# Patient Record
Sex: Female | Born: 1935 | Race: White | Hispanic: No | Smoking: Former smoker
Health system: Southern US, Community
[De-identification: ages and names within clinical notes are randomized; demographics above are authoritative.]

## PROBLEM LIST (undated history)

## (undated) DIAGNOSIS — E119 Type 2 diabetes mellitus without complications: Secondary | ICD-10-CM

## (undated) DIAGNOSIS — K219 Gastro-esophageal reflux disease without esophagitis: Secondary | ICD-10-CM

## (undated) DIAGNOSIS — K635 Polyp of colon: Secondary | ICD-10-CM

## (undated) DIAGNOSIS — K8020 Calculus of gallbladder without cholecystitis without obstruction: Secondary | ICD-10-CM

## (undated) DIAGNOSIS — I639 Cerebral infarction, unspecified: Secondary | ICD-10-CM

## (undated) DIAGNOSIS — K859 Acute pancreatitis without necrosis or infection, unspecified: Secondary | ICD-10-CM

## (undated) DIAGNOSIS — E785 Hyperlipidemia, unspecified: Secondary | ICD-10-CM

## (undated) DIAGNOSIS — I5189 Other ill-defined heart diseases: Secondary | ICD-10-CM

## (undated) DIAGNOSIS — I4819 Other persistent atrial fibrillation: Secondary | ICD-10-CM

## (undated) DIAGNOSIS — I10 Essential (primary) hypertension: Secondary | ICD-10-CM

## (undated) DIAGNOSIS — K449 Diaphragmatic hernia without obstruction or gangrene: Secondary | ICD-10-CM

## (undated) DIAGNOSIS — C44300 Unspecified malignant neoplasm of skin of unspecified part of face: Secondary | ICD-10-CM

## (undated) HISTORY — DX: Acute pancreatitis without necrosis or infection, unspecified: K85.90

## (undated) HISTORY — DX: Gastro-esophageal reflux disease without esophagitis: K21.9

## (undated) HISTORY — PX: OTHER SURGICAL HISTORY: SHX169

## (undated) HISTORY — DX: Unspecified malignant neoplasm of skin of unspecified part of face: C44.300

## (undated) HISTORY — PX: TONSILLECTOMY: SUR1361

## (undated) HISTORY — DX: Polyp of colon: K63.5

## (undated) HISTORY — DX: Hyperlipidemia, unspecified: E78.5

## (undated) HISTORY — PX: BREAST BIOPSY: SHX20

## (undated) HISTORY — DX: Calculus of gallbladder without cholecystitis without obstruction: K80.20

---

## 1970-09-13 HISTORY — PX: APPENDECTOMY: SHX54

## 1970-09-13 HISTORY — PX: ABDOMINAL HYSTERECTOMY: SHX81

## 1998-02-03 ENCOUNTER — Ambulatory Visit (HOSPITAL_COMMUNITY): Admission: RE | Admit: 1998-02-03 | Discharge: 1998-02-03 | Payer: Self-pay | Admitting: Gynecology

## 1998-03-04 ENCOUNTER — Other Ambulatory Visit: Admission: RE | Admit: 1998-03-04 | Discharge: 1998-03-04 | Payer: Self-pay | Admitting: Gynecology

## 1998-03-27 ENCOUNTER — Other Ambulatory Visit: Admission: RE | Admit: 1998-03-27 | Discharge: 1998-03-27 | Payer: Self-pay | Admitting: Gynecology

## 1999-01-29 ENCOUNTER — Ambulatory Visit (HOSPITAL_COMMUNITY): Admission: RE | Admit: 1999-01-29 | Discharge: 1999-01-29 | Payer: Self-pay | Admitting: Family Medicine

## 1999-01-29 ENCOUNTER — Encounter: Payer: Self-pay | Admitting: Family Medicine

## 1999-04-29 ENCOUNTER — Other Ambulatory Visit: Admission: RE | Admit: 1999-04-29 | Discharge: 1999-04-29 | Payer: Self-pay | Admitting: Gynecology

## 2000-01-08 ENCOUNTER — Encounter: Payer: Self-pay | Admitting: Gynecology

## 2000-01-08 ENCOUNTER — Encounter: Admission: RE | Admit: 2000-01-08 | Discharge: 2000-01-08 | Payer: Self-pay | Admitting: Gynecology

## 2000-01-13 ENCOUNTER — Encounter: Payer: Self-pay | Admitting: Gynecology

## 2000-01-13 ENCOUNTER — Encounter: Admission: RE | Admit: 2000-01-13 | Discharge: 2000-01-13 | Payer: Self-pay | Admitting: Gynecology

## 2000-05-26 ENCOUNTER — Other Ambulatory Visit: Admission: RE | Admit: 2000-05-26 | Discharge: 2000-05-26 | Payer: Self-pay | Admitting: Gynecology

## 2000-06-01 ENCOUNTER — Other Ambulatory Visit: Admission: RE | Admit: 2000-06-01 | Discharge: 2000-06-01 | Payer: Self-pay | Admitting: Gynecology

## 2001-01-13 ENCOUNTER — Encounter: Admission: RE | Admit: 2001-01-13 | Discharge: 2001-01-13 | Payer: Self-pay | Admitting: Gynecology

## 2001-01-13 ENCOUNTER — Encounter: Payer: Self-pay | Admitting: Gynecology

## 2001-08-03 ENCOUNTER — Other Ambulatory Visit: Admission: RE | Admit: 2001-08-03 | Discharge: 2001-08-03 | Payer: Self-pay | Admitting: Gynecology

## 2001-08-09 ENCOUNTER — Encounter: Admission: RE | Admit: 2001-08-09 | Discharge: 2001-08-09 | Payer: Self-pay | Admitting: Gynecology

## 2001-08-09 ENCOUNTER — Encounter: Payer: Self-pay | Admitting: Gynecology

## 2002-03-24 ENCOUNTER — Emergency Department (HOSPITAL_COMMUNITY): Admission: EM | Admit: 2002-03-24 | Discharge: 2002-03-24 | Payer: Self-pay | Admitting: Emergency Medicine

## 2002-03-24 ENCOUNTER — Encounter: Payer: Self-pay | Admitting: Emergency Medicine

## 2002-03-26 ENCOUNTER — Emergency Department (HOSPITAL_COMMUNITY): Admission: EM | Admit: 2002-03-26 | Discharge: 2002-03-26 | Payer: Self-pay | Admitting: Emergency Medicine

## 2002-08-20 ENCOUNTER — Encounter: Admission: RE | Admit: 2002-08-20 | Discharge: 2002-08-20 | Payer: Self-pay | Admitting: Gynecology

## 2002-08-20 ENCOUNTER — Encounter: Payer: Self-pay | Admitting: Gynecology

## 2002-08-28 ENCOUNTER — Encounter: Admission: RE | Admit: 2002-08-28 | Discharge: 2002-08-28 | Payer: Self-pay | Admitting: Gynecology

## 2002-08-28 ENCOUNTER — Encounter: Payer: Self-pay | Admitting: Gynecology

## 2002-09-04 ENCOUNTER — Other Ambulatory Visit: Admission: RE | Admit: 2002-09-04 | Discharge: 2002-09-04 | Payer: Self-pay | Admitting: Gynecology

## 2002-09-25 ENCOUNTER — Encounter: Payer: Self-pay | Admitting: General Surgery

## 2002-09-25 ENCOUNTER — Encounter: Admission: RE | Admit: 2002-09-25 | Discharge: 2002-09-25 | Payer: Self-pay | Admitting: General Surgery

## 2002-09-26 ENCOUNTER — Encounter: Payer: Self-pay | Admitting: General Surgery

## 2002-09-26 ENCOUNTER — Encounter: Admission: RE | Admit: 2002-09-26 | Discharge: 2002-09-26 | Payer: Self-pay | Admitting: General Surgery

## 2002-09-26 ENCOUNTER — Encounter (INDEPENDENT_AMBULATORY_CARE_PROVIDER_SITE_OTHER): Payer: Self-pay | Admitting: Specialist

## 2002-09-26 ENCOUNTER — Ambulatory Visit (HOSPITAL_BASED_OUTPATIENT_CLINIC_OR_DEPARTMENT_OTHER): Admission: RE | Admit: 2002-09-26 | Discharge: 2002-09-26 | Payer: Self-pay | Admitting: General Surgery

## 2003-04-18 ENCOUNTER — Ambulatory Visit (HOSPITAL_COMMUNITY): Admission: RE | Admit: 2003-04-18 | Discharge: 2003-04-18 | Payer: Self-pay | Admitting: Gastroenterology

## 2003-04-18 ENCOUNTER — Encounter: Payer: Self-pay | Admitting: Gastroenterology

## 2003-04-18 ENCOUNTER — Encounter (INDEPENDENT_AMBULATORY_CARE_PROVIDER_SITE_OTHER): Payer: Self-pay | Admitting: Specialist

## 2003-08-29 ENCOUNTER — Encounter: Admission: RE | Admit: 2003-08-29 | Discharge: 2003-08-29 | Payer: Self-pay | Admitting: Gynecology

## 2003-10-10 ENCOUNTER — Other Ambulatory Visit: Admission: RE | Admit: 2003-10-10 | Discharge: 2003-10-10 | Payer: Self-pay | Admitting: Gynecology

## 2004-08-25 ENCOUNTER — Other Ambulatory Visit: Admission: RE | Admit: 2004-08-25 | Discharge: 2004-08-25 | Payer: Self-pay | Admitting: Gynecology

## 2004-09-01 ENCOUNTER — Encounter: Admission: RE | Admit: 2004-09-01 | Discharge: 2004-09-01 | Payer: Self-pay | Admitting: Gynecology

## 2004-09-13 HISTORY — PX: HIATAL HERNIA REPAIR: SHX195

## 2005-07-18 ENCOUNTER — Inpatient Hospital Stay (HOSPITAL_COMMUNITY): Admission: EM | Admit: 2005-07-18 | Discharge: 2005-07-30 | Payer: Self-pay | Admitting: Emergency Medicine

## 2005-07-20 ENCOUNTER — Encounter (INDEPENDENT_AMBULATORY_CARE_PROVIDER_SITE_OTHER): Payer: Self-pay | Admitting: *Deleted

## 2005-07-29 ENCOUNTER — Encounter: Admission: RE | Admit: 2005-07-29 | Discharge: 2005-07-29 | Payer: Self-pay | Admitting: Neurosurgery

## 2005-08-25 ENCOUNTER — Ambulatory Visit: Payer: Self-pay | Admitting: Gastroenterology

## 2005-08-25 ENCOUNTER — Ambulatory Visit: Payer: Self-pay

## 2005-08-30 ENCOUNTER — Ambulatory Visit: Payer: Self-pay | Admitting: Cardiology

## 2005-09-01 ENCOUNTER — Ambulatory Visit (HOSPITAL_COMMUNITY): Admission: RE | Admit: 2005-09-01 | Discharge: 2005-09-01 | Payer: Self-pay | Admitting: Gastroenterology

## 2005-09-10 ENCOUNTER — Ambulatory Visit: Payer: Self-pay | Admitting: Cardiology

## 2005-09-13 HISTORY — PX: OTHER SURGICAL HISTORY: SHX169

## 2005-09-28 ENCOUNTER — Ambulatory Visit: Payer: Self-pay | Admitting: Gastroenterology

## 2005-09-28 DIAGNOSIS — K449 Diaphragmatic hernia without obstruction or gangrene: Secondary | ICD-10-CM | POA: Insufficient documentation

## 2006-01-19 ENCOUNTER — Ambulatory Visit: Payer: Self-pay | Admitting: Internal Medicine

## 2006-01-24 ENCOUNTER — Ambulatory Visit (HOSPITAL_COMMUNITY): Admission: RE | Admit: 2006-01-24 | Discharge: 2006-01-24 | Payer: Self-pay | Admitting: Internal Medicine

## 2006-02-14 ENCOUNTER — Ambulatory Visit: Payer: Self-pay | Admitting: Cardiology

## 2006-02-23 ENCOUNTER — Ambulatory Visit (HOSPITAL_BASED_OUTPATIENT_CLINIC_OR_DEPARTMENT_OTHER): Admission: RE | Admit: 2006-02-23 | Discharge: 2006-02-23 | Payer: Self-pay | Admitting: Orthopedic Surgery

## 2006-07-06 ENCOUNTER — Ambulatory Visit (HOSPITAL_BASED_OUTPATIENT_CLINIC_OR_DEPARTMENT_OTHER): Admission: RE | Admit: 2006-07-06 | Discharge: 2006-07-06 | Payer: Self-pay | Admitting: Orthopedic Surgery

## 2006-09-13 HISTORY — PX: CHOLECYSTECTOMY: SHX55

## 2006-09-15 ENCOUNTER — Other Ambulatory Visit: Admission: RE | Admit: 2006-09-15 | Discharge: 2006-09-15 | Payer: Self-pay | Admitting: Gynecology

## 2006-09-27 ENCOUNTER — Encounter: Admission: RE | Admit: 2006-09-27 | Discharge: 2006-09-27 | Payer: Self-pay | Admitting: Family Medicine

## 2006-10-07 ENCOUNTER — Encounter: Admission: RE | Admit: 2006-10-07 | Discharge: 2006-10-07 | Payer: Self-pay | Admitting: Family Medicine

## 2007-02-01 ENCOUNTER — Inpatient Hospital Stay (HOSPITAL_COMMUNITY): Admission: EM | Admit: 2007-02-01 | Discharge: 2007-02-14 | Payer: Self-pay | Admitting: Emergency Medicine

## 2007-02-01 ENCOUNTER — Ambulatory Visit: Payer: Self-pay | Admitting: Cardiology

## 2007-02-01 ENCOUNTER — Ambulatory Visit: Payer: Self-pay | Admitting: Cardiovascular Disease

## 2007-02-06 ENCOUNTER — Encounter (INDEPENDENT_AMBULATORY_CARE_PROVIDER_SITE_OTHER): Payer: Self-pay | Admitting: General Surgery

## 2007-02-07 ENCOUNTER — Ambulatory Visit: Payer: Self-pay | Admitting: Gastroenterology

## 2007-03-09 ENCOUNTER — Inpatient Hospital Stay (HOSPITAL_COMMUNITY): Admission: AD | Admit: 2007-03-09 | Discharge: 2007-03-11 | Payer: Self-pay | Admitting: General Surgery

## 2007-03-16 ENCOUNTER — Ambulatory Visit (HOSPITAL_COMMUNITY): Admission: RE | Admit: 2007-03-16 | Discharge: 2007-03-16 | Payer: Self-pay | Admitting: Gastroenterology

## 2007-03-16 ENCOUNTER — Encounter: Payer: Self-pay | Admitting: Gastroenterology

## 2007-03-18 ENCOUNTER — Emergency Department (HOSPITAL_COMMUNITY): Admission: EM | Admit: 2007-03-18 | Discharge: 2007-03-19 | Payer: Self-pay | Admitting: Emergency Medicine

## 2007-03-20 ENCOUNTER — Ambulatory Visit: Payer: Self-pay | Admitting: Gastroenterology

## 2007-03-22 ENCOUNTER — Ambulatory Visit: Payer: Self-pay | Admitting: Internal Medicine

## 2007-03-22 LAB — CONVERTED CEMR LAB
Basophils Relative: 0.3 % (ref 0.0–1.0)
Bilirubin, Direct: 0.1 mg/dL (ref 0.0–0.3)
Eosinophils Absolute: 0.1 10*3/uL (ref 0.0–0.6)
Eosinophils Relative: 1.8 % (ref 0.0–5.0)
Hemoglobin: 13.1 g/dL (ref 12.0–15.0)
Lipase: 29 units/L (ref 11.0–59.0)
Lymphocytes Relative: 25.4 % (ref 12.0–46.0)
MCV: 87.4 fL (ref 78.0–100.0)
Monocytes Absolute: 0.4 10*3/uL (ref 0.2–0.7)
Neutro Abs: 3.6 10*3/uL (ref 1.4–7.7)
Neutrophils Relative %: 65.2 % (ref 43.0–77.0)
Total Bilirubin: 0.8 mg/dL (ref 0.3–1.2)
Total Protein: 6.3 g/dL (ref 6.0–8.3)
WBC: 5.5 10*3/uL (ref 4.5–10.5)

## 2007-03-24 ENCOUNTER — Ambulatory Visit: Payer: Self-pay | Admitting: Gastroenterology

## 2007-05-04 ENCOUNTER — Ambulatory Visit (HOSPITAL_COMMUNITY): Admission: RE | Admit: 2007-05-04 | Discharge: 2007-05-04 | Payer: Self-pay | Admitting: Gastroenterology

## 2007-05-17 ENCOUNTER — Ambulatory Visit: Payer: Self-pay | Admitting: Gastroenterology

## 2007-11-09 ENCOUNTER — Encounter: Admission: RE | Admit: 2007-11-09 | Discharge: 2007-11-09 | Payer: Self-pay | Admitting: Family Medicine

## 2008-01-03 DIAGNOSIS — I10 Essential (primary) hypertension: Secondary | ICD-10-CM | POA: Insufficient documentation

## 2008-01-03 DIAGNOSIS — K219 Gastro-esophageal reflux disease without esophagitis: Secondary | ICD-10-CM | POA: Insufficient documentation

## 2008-01-03 DIAGNOSIS — E119 Type 2 diabetes mellitus without complications: Secondary | ICD-10-CM

## 2008-12-10 ENCOUNTER — Encounter: Admission: RE | Admit: 2008-12-10 | Discharge: 2008-12-10 | Payer: Self-pay | Admitting: Family Medicine

## 2009-01-30 IMAGING — RF DG CHOLANGIOGRAM ADD FILMS
13 series · 13 of 13 positions shown · non-contrast
Comparison: Intraoperative cholangiogram 02/06/07.

CLINICAL DATA: Status-post cholecystectomy for cholecystitis and choledocholithiasis.  A T-tube drainage catheter has been left in place postoperatively in the common bile duct.  Further cholangiogram assessment is now performed to determine whether there are any retained calculi in the duct. 
CHOLANGIOGRAM THROUGH PREEXISTING T-TUBE DRAINAGE CATHETER ? 02/14/07:

[Series 1: run · 1 of 1 slices shown (1 of 13)]
[im 1/1]
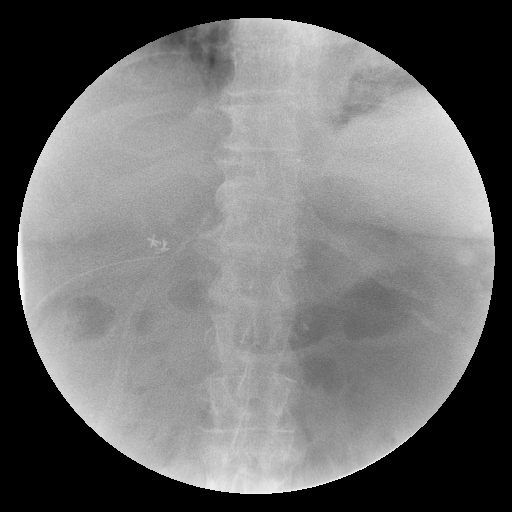

[Series 2: run · 1 of 1 slices shown (2 of 13)]
[im 1/1]
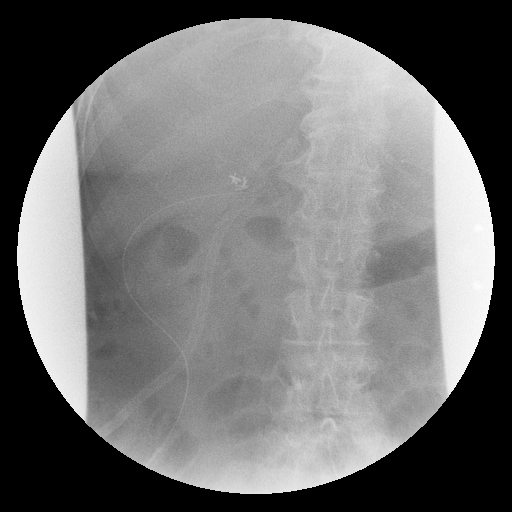

[Series 3: run · 1 of 1 slices shown (3 of 13)]
[im 1/1]
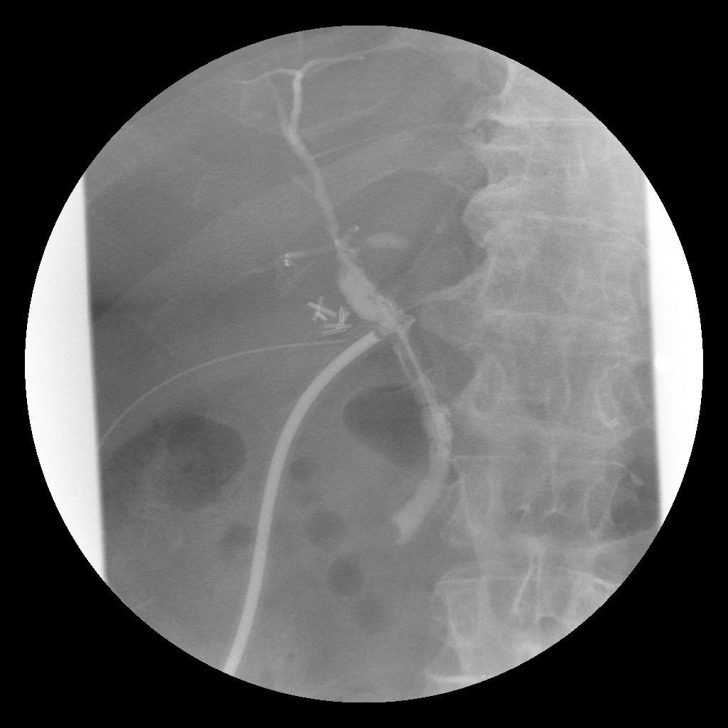

[Series 4: run · 1 of 1 slices shown (4 of 13)]
[im 1/1]
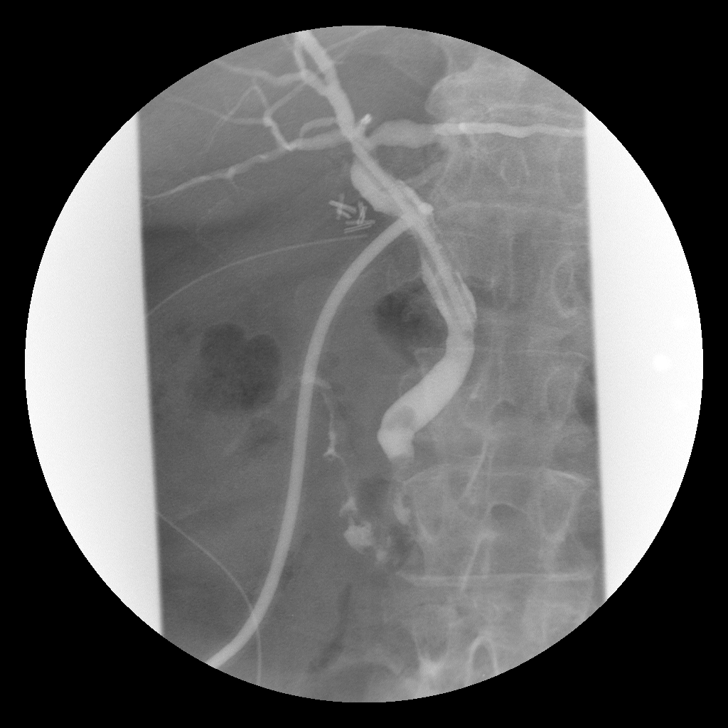

[Series 5: run · 1 of 1 slices shown (5 of 13)]
[im 1/1]
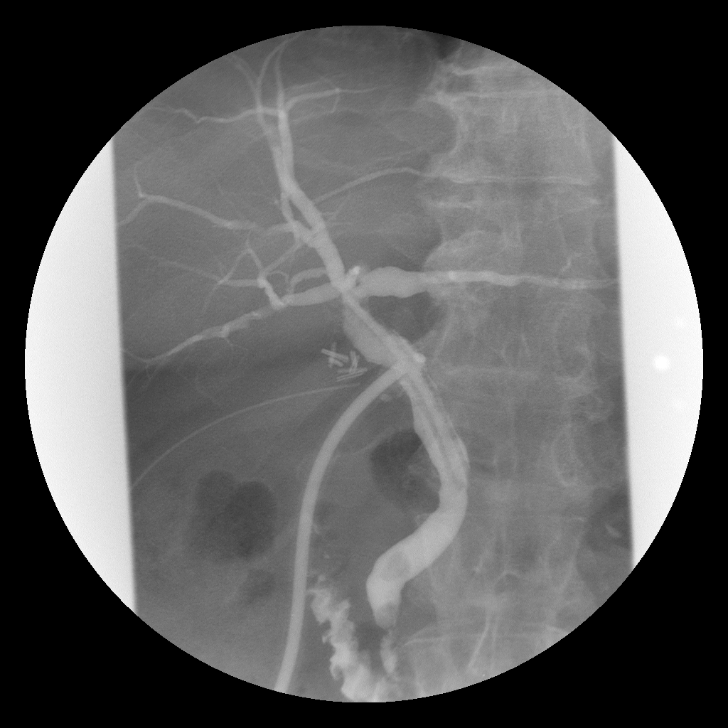

[Series 6: run · 1 of 1 slices shown (6 of 13)]
[im 1/1]
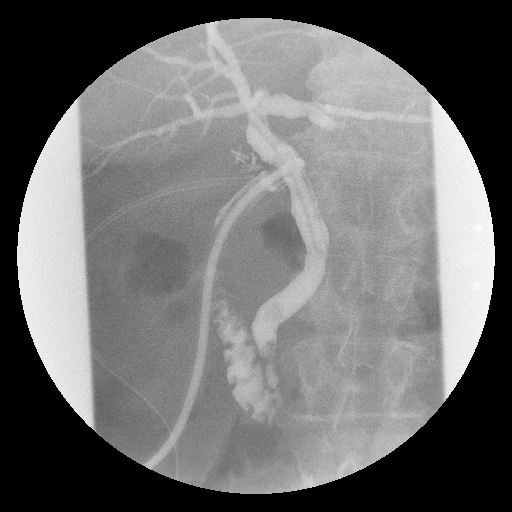

[Series 7: run · 1 of 1 slices shown (7 of 13)]
[im 1/1]
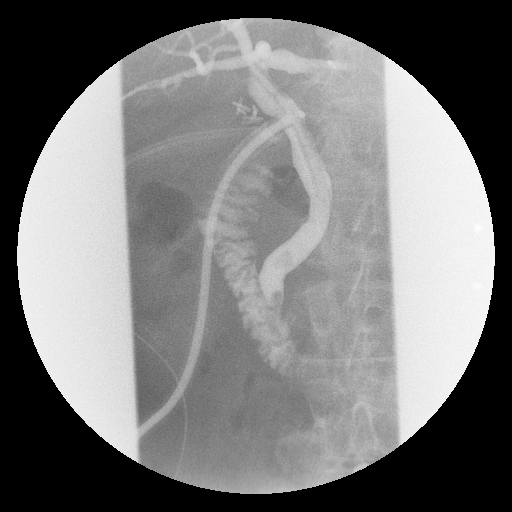

[Series 8: run · 1 of 1 slices shown (8 of 13)]
[im 1/1]
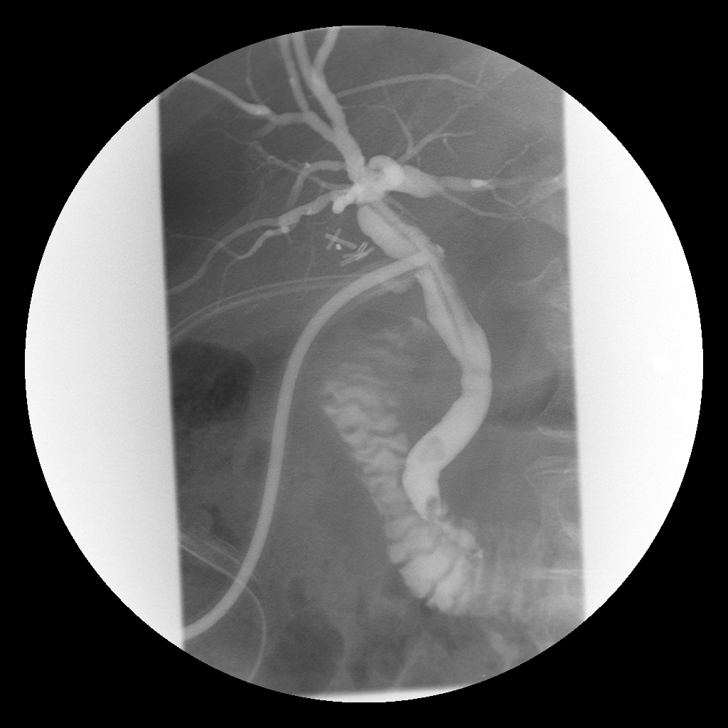

[Series 9: run · 1 of 1 slices shown (9 of 13)]
[im 1/1]
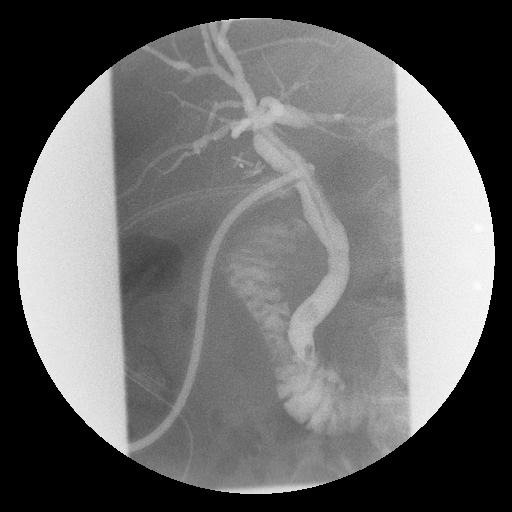

[Series 10: run · 1 of 1 slices shown (10 of 13)]
[im 1/1]
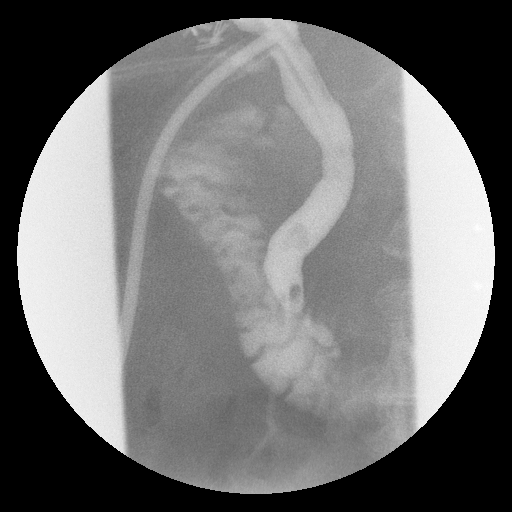

[Series 11: run · 1 of 1 slices shown (11 of 13)]
[im 1/1]
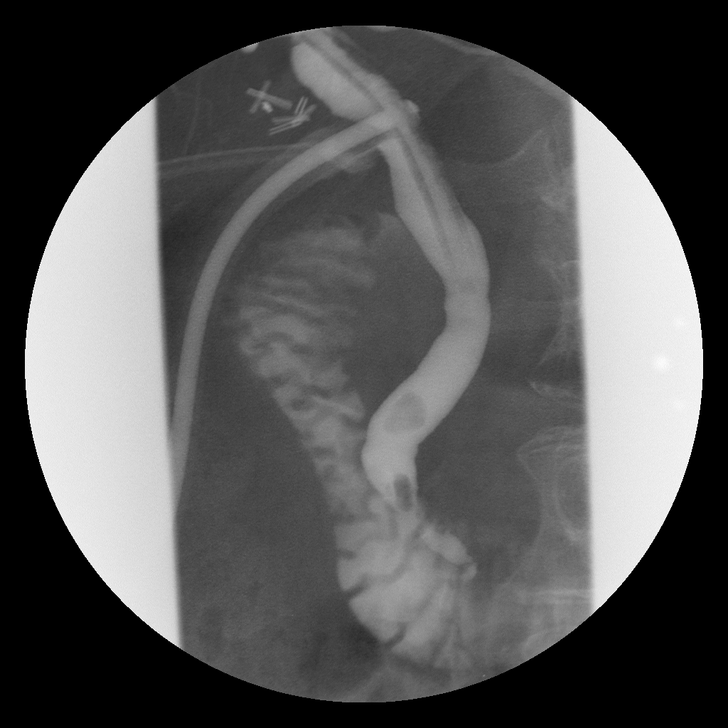

[Series 12: run · 1 of 1 slices shown (12 of 13)]
[im 1/1]
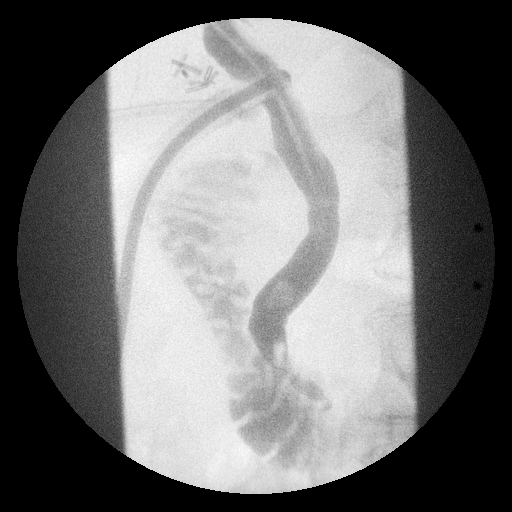

[Series 13: run · 1 of 1 slices shown (13 of 13)]
[im 1/1]
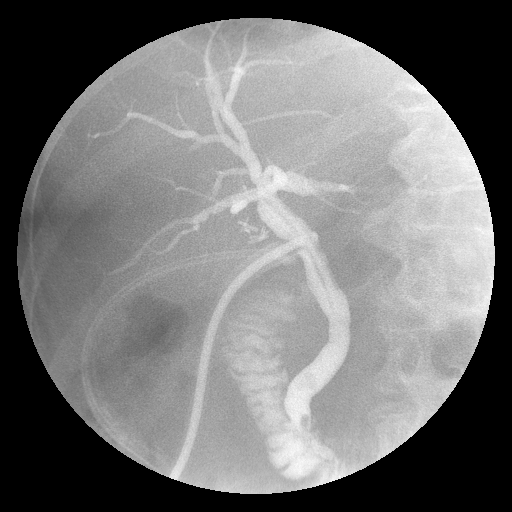

[13 of 13 positions shown; findings below may reference images not displayed]

FINDINGS: Initial fluoroscopy was performed of the right abdomen.   The percutaneous T-tube was accessed utilizing sterile technique and introduction of a needle into the external tubing.  Contras injection was then performed with instillation of Omnipaque 300 contrast material.
FINDINGS: The T-tube is patent with insertion site in the upper common bile duct. Injection of contrast material demonstrates a non-obstructed biliary system with flow of contrast into both intrahepatic ducts as well as distally into the duodenum.  There are 2 focal persistent filling defects in the distal common bile duct which do not change in shape or location during the examination.  These are likely due to retained stones in the distal duct.   These stones are not occlusive to the flow of contrast around and into the duodenum.   The tube was flushed after completion of the cholangiogram.
IMPRESSION: Two discrete filling defects in the distal common bile duct by T-tube cholangiogram consistent with retained calculi.   These do not obstruct flow of contrast into the duodenum.   No associated stricture is seen.

## 2009-04-09 ENCOUNTER — Encounter: Admission: RE | Admit: 2009-04-09 | Discharge: 2009-04-09 | Payer: Self-pay | Admitting: Family Medicine

## 2009-09-13 DIAGNOSIS — E119 Type 2 diabetes mellitus without complications: Secondary | ICD-10-CM

## 2009-09-13 HISTORY — DX: Type 2 diabetes mellitus without complications: E11.9

## 2009-12-24 ENCOUNTER — Encounter: Admission: RE | Admit: 2009-12-24 | Discharge: 2009-12-24 | Payer: Self-pay | Admitting: Family Medicine

## 2010-10-04 ENCOUNTER — Encounter: Payer: Self-pay | Admitting: Family Medicine

## 2011-01-04 ENCOUNTER — Other Ambulatory Visit: Payer: Self-pay | Admitting: Family Medicine

## 2011-01-04 DIAGNOSIS — Z1231 Encounter for screening mammogram for malignant neoplasm of breast: Secondary | ICD-10-CM

## 2011-01-11 ENCOUNTER — Ambulatory Visit
Admission: RE | Admit: 2011-01-11 | Discharge: 2011-01-11 | Disposition: A | Discharge status: Home / Self Care | Payer: Medicare Other | Source: Ambulatory Visit | Attending: Family Medicine | Admitting: Family Medicine

## 2011-01-11 DIAGNOSIS — Z1231 Encounter for screening mammogram for malignant neoplasm of breast: Secondary | ICD-10-CM

## 2011-01-26 NOTE — Consult Note (Signed)
[name], [name]                ACCOUNT NO.:  [id number]   MEDICAL RECORD NO.:  [id number]          PATIENT TYPE:  INP   LOCATION:  4714                         FACILITY:  [facility]   PHYSICIAN:  Gerrit Friends. Dietrich Pates, MD, FACCDATE OF BIRTH:  1926/06/21   DATE OF CONSULTATION:  DATE OF DISCHARGE:                                 CONSULTATION   [name] [name] number [id number] the date of 02/03/2007   REFERRING PHYSICIAN:  Dr. Tommie Ard.   PRIMARY CARE PHYSICIAN:  Dr. Aida Puffer, [place], [place].   PRIMARY CARDIOLOGIST:  Dr. Antoine Poche.   HISTORY OF PRESENT ILLNESS:  This 77 year old woman with long history of  nonsustained supraventricular tachycardia referred for evaluation of  same prior to planned cholecystectomy.  [name] [name] was admitted with  acute cholecystitis.  She has improved with antibiotic therapy.  She  apparently has a common duct stone and will require an open surgical  procedure.   Her cardiac history dates to 2006 when she was noted to have rhythm  irregularities.  Holter monitoring showed frequent PACs with occasional  runs of PSVT.  The patient has been asymptomatic with respect to her  arrhythmia.  She has never had a sustained episode of which we are  aware.  She was started on a minimal dose beta blocker and has done well  without subsequent cardiology follow-up.   The patient has had no chest discomfort.  She has no dyspnea on  exertion.  She does have diabetes managed with oral medication.  She  also has hypertension that has apparently been well-controlled with  minimal medical therapy.  She has a very remote history of cigarette  smoking.   PAST MEDICAL HISTORY:  Otherwise notable for DJD of the spine with  chronic back pain and resection of a left breast intraductal papillary  carcinoma after which no chemotherapy nor radiation therapy was  required.  She has GERD and a history of diverticulosis.  She has  undergone hysterectomy, appendectomy  and resection of a neoplastic  lesion from the skin of her neck.   MEDICATIONS ON ADMISSION:  Included Prilosec, HCTZ, KCl, metoprolol,  metformin, Enablex  and aspirin.   ALLERGIES:  She has no known allergies.   SOCIAL HISTORY:  Lives alone in [place], [place]; widowed; retired  from office work.  Approximately 20 pack-year history of cigarette  smoking that was discontinued in 1970.  No excessive use of alcohol.   FAMILY HISTORY:  Mother died at age 42 with a history of hypertension.  Father is deceased - details of medical history unknown.  She has one  sibling who has had colon cancer and one sibling who died with COPD.   REVIEW OF SYSTEMS:  Is notable for rare headaches, need for corrective  lenses, nocturia times one.  She has arthralgias in both knees.  All  other systems reviewed and are negative.   PHYSICAL EXAMINATION:  On exam, thin, exhausted woman sleeping  throughout the exam in no acute distress.  The temperature is 98.2,  heart rate 60 and regular, respirations 18, blood  pressure 105/50,  weight 64 kg, O2 saturation 98% on 2 liters.  Afebrile.  HEENT:  Normal  eyelids.  NECK:  No jugular venous distension; no carotid bruits.  LUNGS:  Clear.  CARDIAC:  Normal first and second heart sounds; modest early systolic  ejection murmur.  ABDOMEN:  Soft and nontender; normal bowel sounds; no organomegaly.  EXTREMITIES:  No edema; 1+ distal pulses.   EKG:  Sinus bradycardia with frequent PACs; nondiagnostic lateral Q-  waves; QT prolongation; delayed R-wave progression.   CHEST X-RAY:  Changes of COPD.   OTHER LABORATORY:  Notable for potassium of 2.7, normal CBC except for  platelets 125 and normal renal function.  LFTs are abnormal consistent  with her acute cholecystitis. One of two blood cultures grew E-coli.   IMPRESSION:  [name] [name] has acute cholecystitis with a retained stone  and apparently needs surgery.  She has no cardiac history that would be   indicative of increased risk.  Her arrhythmia has been mild and  transient.  She is not tolerating significant beta blocker due to sinus  bradycardia at rest.  We will try to substitute pindolol for metoprolol  to gain some intrinsic sympathomimetic activity.  Even with this, she  probably will not tolerate a substantial dose of beta blocker.  If this  trial is unsuccessful, calcium channel antagonist can be tried.  No  further cardiology testing is anticipated, and no further delay will  reduce the risk of surgery from a cardiac standpoint.  An echocardiogram  is pending.  We appreciate the request for consultation and will be  happy to follow this nice woman with you.      Gerrit Friends. Dietrich Pates, MD, [facility]  Electronically Signed     RMR/MEDQ  D:  02/03/2007  T:  02/03/2007  Job:  [id number]

## 2011-01-26 NOTE — Assessment & Plan Note (Signed)
[facility]                         GASTROENTEROLOGY OFFICE NOTE   NAME:[name] [name], [name] [name]                       MRN:          [id number]  DATE:03/24/2007                            DOB:          09-09-1926    PRIMARY CARE PHYSICIAN:  Dr. Aida Puffer.   PRIMARY SURGEON:  Dr. Durene Fruits. Carolynne Edouard.   GI PROBLEM LIST:  1. Choledocholithiasis.  Two failed ERCPs by myself and Dr. Arlyce Dice,      May 2008.  This was followed by open cholecystectomy by Dr. Demetrius Charity. S.      Toth with T-tube placement, common duct exploration.  Repeat T-tube      cholangiogram 1 month after surgery showed retained CBD stones.      Underwent combined IR/GI rendezvous procedure, July 2008 that      included biliary sphincterotomy and biliary stent placement, as      well as gallstone removal.  She is going to be arranged for a      repeat ERCP in 5-6 weeks for stent removal and repeat sweeping of      bile duct.  This was all brought about by nausea and vomiting      episodes that lead to imaging testing and labs.   INTERVAL HISTORY:  I last saw [name] [name] about a week ago at the time of her  combined rendezvous procedure.  She had some abdominal discomfort,  nausea, vomiting afterwards.  This has been a chronic problem for her  since this spring.  She presented to [facility] emergency room,  evaluation there was essentially negative.  She continued to have  discomfort since she was set up for this appointment.  Her primary issue  is nausea and intermittent vomiting.  Repeat labs done 2 days ago show  normal CBC and ALT of 99, but otherwise normal liver tests and normal  lipase.  She has been afebrile.   CURRENT MEDICATIONS:  1. Multivitamin.  2. Aspirin.  3. Calcium.  4. Metformin.  5. Metoprolol.  6. MiraLax.  7. Hydrochlorothiazide.  8. Enablex (she rarely takes this).  9. Citracal.  10.Prevacid.  11.Omega-3.  12.Prilosec.  13.Valtrex she takes daily.  She has taken this since the winter  of      2007-2008 for question of herpes infection following corneal      transplant.  She has not been back to her ophthalmologist at [facility] due to the recent biliary issues.   PHYSICAL EXAMINATION:  Weight 233 pounds, temperature 98.3, blood  pressure 122/72, pulse 80.  CONSTITUTIONAL:  Generally well appearing.  ABDOMEN:  Soft, nontender, nondistended, normal bowel sounds.  She still  had Steri-Strips in place from her open cholecystectomy which I removed.  She had a bandage at the site of the T-tube that was removed last week.  I removed this bandage and there was no drainage there.   ASSESSMENT AND PLAN:  This is a 77 year old with choledocholithiasis,  see above, continued nausea and vomiting.   Valtrex, which she has been on for 6 months, is listed as having nausea  as its most common side effect.  I recommended that she hold this for  now.  We will get in touch with her ophthalmologist at [facility], Dr.  Laurine Blazer, his phone number is [phone], to let him know we are  recommending she hold this.  She was already scheduled to see him in 3  weeks time.  She will also be getting in touch with his office to make  sure that it is safe for her to come off this medicine.  It is not  completely clear that this is the cause, but perhaps it is contributing  to her biliary nausea.  She will call me if she is not better after  holding this medicine for 7-10 days.  She has Phenergan that was called  in for her a couple of days ago.  She will take scheduled for now.  We  will make sure she has arranged for repeat ERCP in 5 weeks' time.     Rachael Fee, MD  Electronically Signed    DPJ/MedQ  DD: 03/24/2007  DT: 03/25/2007  Job #: [id number]   cc:   Baruch Merl Vernell Morgans, M.D.  Pilar Grammes, MD

## 2011-01-26 NOTE — Consult Note (Signed)
NAME:  [name] [name], [name] [name]                ACCOUNT NO.:  [id number]   MEDICAL RECORD NO.:  [id number]          PATIENT TYPE:  INP   LOCATION:  4714                         FACILITY:  [facility]   PHYSICIAN:  Ollen Gross. Vernell Morgans, M.D. DATE OF BIRTH:  01-20-1926   DATE OF CONSULTATION:  02/03/2007  DATE OF DISCHARGE:                                 CONSULTATION   GASTROENTEROLOGY:  Rachael Fee, M.D.   PRIMARY GASTROENTEROLOGIST:  Ulyess Mort, M.D.   PRIMARY CARE PHYSICIAN:  Dr. Clarene Duke in [place], [place].   ADMITTING PHYSICIAN:  [place].   CARDIOLOGIST:  Rollene Rotunda, MD, [facility].   REASON FOR CONSULTATION:  Common bile duct stone, failed ERCP x2.   HISTORY PRESENT ILLNESS:  [name] is a 77 year old female patient  with a history diabetes, hypertension, and problems related to chronic  pain from compression fractures in the thoracic spine.  This past Monday  she developed severe back pain.  Initially she thought it was related to  the T-spine problems.  The pain was radiating through to the epigastrium  and when asked about the pain she did state that it was different than  her prior back pain.  This pain lasted for several hours and resolved  spontaneously.  The pain returned on Wednesday and was unrelenting.  It  was associated this time with nausea and vomiting.  She presented to the  ER, where she was admitted by internal medicine.  An ultrasound was  performed but did not show any actual stones within the gallbladder.  But, she did have common bile duct and pancreatic ductal dilatation.  She had significant transaminitis and mild pancreatitis with mild  elevation in amylase.  Based on these findings, the patient was  admitted, GI consultation was obtained.  She has undergone ERCP x2 with  unsuccessful retrieval of stones from the common bile duct.  Because of  this, surgical consultation has been requested.   REVIEW OF SYSTEMS:  As above.  As noted, the patient has  never had back  pain like this before.  This time it was associated with nausea and  vomiting.  No definite postprandial symptoms.  No pain since admission.  She has had no fevers or chills.   SOCIAL HISTORY:  No alcohol.  No tobacco.   PAST MEDICAL HISTORY:  1. Hypertension.  2. Diabetes.  3. Colonic diverticula.  4. Right lower lobe pulmonary nodule, chronic.  5. History of recurrent SVT, previously evaluated by cardiology.  6. Thoracic compression fractures with chronic pain and radiculopathy,      status post prior epidural injections.   SURGICAL HISTORY:  1. Left breast surgery to remove intraductal papilloma in 2004.  2. Bilateral carpal tunnel release.  3. Left corneal transplant 6 months ago.   ALLERGIES:  NKDA.   CURRENT MEDICATIONS:  While hospitalized, the patient has been on  Protonix, Lopressor, multiple vitamins, IV fluids, and various p.r.n.  medications, as well as IV Zosyn. Marland Kitchen   MEDICATIONS AT HOME:  Include Prilosec, hydrochlorothiazide, potassium  replacement, metoprolol, metformin, multiple vitamin, aspirin, Citracal,  Omega-III, vitamin D, Valtrex, Enablex.   PHYSICAL EXAMINATION:  GENERAL:  Pleasant female patient currently  denying abdominal pain.  VITAL SIGNS:  Temperature 98.2, BP 103/46, pulse 61 and regular,  respirations 18.  NEURO:  The patient is alert and oriented x3, moving all extremities x4.  No focal deficits.  HEENT:  Head normocephalic.  Sclerae not injected.  No icterus of the  sclerae.  NECK:  Supple.  No adenopathy.  CHEST:  Bilateral lung sounds are clear to auscultation.  Respiratory  effort is non-labored.  She is on room air.  CARDIAC:  S1-S2 without rubs, murmurs, thrills, or gallops.  She is  currently maintaining a sinus rhythm/sinus tach with a bedside telemetry  monitor.  No JVD.  ABDOMEN:  Soft, nontender, nondistended without hepatosplenomegaly,  masses, or bruits.  EXTREMITIES:  Symmetrical in appearance with only trace  lower extremity  edema.  Pulses are palpable.   LABORATORY:  Blood cultures one bottle has shown up positive for E coli,  sensitivities are pending.  Sodium 139, potassium 2.9, CO2 26, glucose  68, BUN 18, creatinine 0.69.  Total bilirubin 1.5, alkaline phosphatase  100, AST 271, ALT 495, peak total bilirubin 4.7.  Peak AST 1720.  Peak  ALT 1029.  Amylase on admission was 148, lipase was 31.  PT 17.4, INR  1.4.  Peak white count 11,000, today it is 7200, hemoglobin 11.5, and  platelets 116,000.   DIAGNOSTIC STUDIES:  Ultrasound of the abdomen again demonstrates  gallbladder wall thickening without stones, gallbladder wall measures  3.9-mm, bile duct measured 10-mm, pancreatic duct measures 2.7-mm.   IMPRESSION:  1. Biliary colic currently resolved secondary to common bile duct      obstruction.  2. Possible E.  Coli bacteremia based on blood culture results.  3. Transaminitis, improving.  4. Mild biliary pancreatitis, resolving.  5. Hypertension.  6. Recurrent supraventricular tachycardia exacerbated by stress,      hypovolemia, and hypokalemia.  7. Diabetes.  8. Mild thrombocytopenia.   PLAN:  1. Open cholecystectomy next week for a common bile duct exploration.  2. Cardiology is currently evaluate the patient regarding the SVT.  I      have gotten a tentative okay from the PA that the patient is      appropriate for surgical anesthesia.  3. Await LFTs and platelets to further normalize before proceeding      with surgery.  4. The patient currently on Zosyn.  Follow up on blood culture since      one bottle is positive for E-coli.  5. Uncertain at this point if we can go ahead and feed the patient a      low fat liquid diet and observe her symptoms.  6. Agree with repletion of K. This will also help prevent further SVT.  7. I discussed with the patient the surgical expectations and the post-     op period, regarding incision, drain placement, and the probability      with a  common bile duct exploration she will go home with a T tube      in place.      She verbalizes understanding.  Additional risks and benefits of the      surgery regarding infection, bleeding, anesthesia risks, etc., to      be discussed by Dr. Carolynne Edouard as well.  Any other additional      recommendations per Dr. Carolynne Edouard after he evaluates the patient.      Revonda Standard  L. Rennis Harding, N.POllen Gross. Vernell Morgans, M.D.  Electronically Signed    ALE/MEDQ  D:  02/03/2007  T:  02/03/2007  Job:  [id number]   cc:   Clarene Duke, Dr.  Rollene Rotunda, MD, [facility]  Ulyess Mort, MD

## 2011-01-26 NOTE — H&P (Signed)
NAME:  [name] [name], [name] [name]                ACCOUNT NO.:  [id number]   MEDICAL RECORD NO.:  [id number]          PATIENT TYPE:  INP   LOCATION:  5524                         FACILITY:  [facility]   PHYSICIAN:  Ollen Gross. Vernell Morgans, M.D. DATE OF BIRTH:  04/15/1926   DATE OF ADMISSION:  03/09/2007  DATE OF DISCHARGE:                              HISTORY & PHYSICAL   Ms. Turner is a 77 year old white female who is now about a month out  from an open common bile duct exploration for common bile duct stones  after a failed ERCP.  T-tube cholangiogram was placed.  Initially, her  intraoperative cholangiogram did not show any retained stones, but a  subsequent T-tube cholangiogram done after surgery did show two small  retained common duct stones.  She since discharge from the hospital has  been having some significant back pain, some milder upper abdominal  pain, and has had significant problems with nausea and vomiting and  difficulty keeping food down.  She denies any fevers or chills.  She  comes to the clinic today.  Her T-tube cholangiogram has been capped for  about three weeks.  She has had nothing coming out of her JP drain.  The  JP drain was then removed today in the clinic and at that time, she was  noted to have a moderate amount of bile drainage from the JP site.   PAST MEDICAL HISTORY:  Significant for:  1. Hypertension.  2. Diabetes.  3. Colon diverticula.  4. Chronic right lung nodule.  5. History of recurrent SVT.  6. Thoracic spine fractures and radiculopathy.   PAST SURGICAL HISTORY:  Significant for:  1. Left breast lumpectomy for benign disease.  2. Bilateral carpal tunnel release.  3. Left corneal transplant.   MEDICATIONS:  Include hydrochlorothiazide, metoprolol, metformin,  potassium, multivitamin, aspirin, Caltrate, Enablex, omega3, Valtrex,  pedally, benazepril.   ALLERGIES:  No known drug allergies.   SOCIAL HISTORY:  She denies use of alcohol or tobacco products.   FAMILY HISTORY:  Noncontributory.   PHYSICAL EXAMINATION:  GENERAL:  She is a well-developed, well-nourished  white female in no acute distress.  SKIN:  Warm and dry.  No jaundice.  HEENT:  Eyes are anicteric.  Extraocular muscles intact.  Pupils equal,  round and reactive to light.  Sclerae nonicteric.  LUNGS:  Clear bilaterally with no use of accessory respiratory muscles.  HEART:  Regular rate and rhythm.  Impulse in the left chest.  ABDOMEN:  Soft with some mild upper abdominal tenderness.  Incisions  healing up nicely.  T-tube is capped.  JP is not putting out anything.  EXTREMITIES:  No clubbing, cyanosis or edema with good strength in her  arms and legs.  PSYCHOLOGICALLY:  She is alert and oriented x3 with no evidence today of  anxiety or depression.   ASSESSMENT/PLAN:  This is a 77 year old white female a month out from  common bile duct exploration.  She now has a T-tube and she does have  two small retained common duct stones on cholangiogram.  After removing  her JP today, she  had a moderate amount of bowel leakage from the JP  site.  This could be a sign of a significant myeloma which could account  for her nausea, vomiting and back pain.  She also has had trouble  keeping food down.  I suspect she is dehydrated to some level.  We will  plan to admit her to the hospital for pain management and IV hydration.  We will obtain a CT scan to look for a myeloma with possible perc drain  if she has that.  We will also consult her gastroenterologist to  consider repeat ERCP.      Ollen Gross. Vernell Morgans, M.D.  Electronically Signed     PST/MEDQ  D:  03/09/2007  T:  03/09/2007  Job:  [id number]

## 2011-01-26 NOTE — Discharge Summary (Signed)
NAME:  [name] [name], [name] [name]                ACCOUNT NO.:  [id number]   MEDICAL RECORD NO.:  [id number]          PATIENT TYPE:  INP   LOCATION:  4714                         FACILITY:  [facility]   PHYSICIAN:  Michaelyn Barter, M.D. DATE OF BIRTH:  04-15-1926   DATE OF ADMISSION:  02/01/2007  DATE OF DISCHARGE:                               DISCHARGE SUMMARY   PRIMARY CARE DOCTOR:  Dr. Clarene Duke of [place], [place].   FINAL DIAGNOSES:  1. Cholangitis.  2. Common bile duct obstruction secondary to stones.  3. Cholecystitis.  4. Escherichia coli bacteremia.  5. Hyperkalemia.  6. Transaminitis.  7. Hypoalbuminemia.  8. Arrhythmia.   CONSULTS:  1. Gastroenterology, [place], Dr. Arlyce Dice.  2. General surgery, Dr. Carolynne Edouard.  3. Cardiology, [facility], Dr. Dietrich Pates.   PROCEDURES:  1. Laparoscopic cholecystectomy with intraoperative cholangiogram and      open common bile duct exploration with the placement of a T-tube,      completed by Dr. Carolynne Edouard on Feb 06, 2007.  2. Portable chest x-ray completed Feb 01, 2007.  3. CT scan of the chest with angiographic contrast on Feb 01, 2007.  4. X-ray of the thoracic and lumbar spine on Feb 01, 2007.  5. Complete abdominal x-ray on Feb 01, 2007.   HISTORY OF PRESENT ILLNESS:  Ms. Birden is a 77 year old female who  arrived complaining of mid region lower back pain that started  approximately two nights prior to admission.  Her pain was just below  her shoulder blades and characterized as a 10/10 in intensity.  Her pain  had been constant up until the morning of her admission, at which time  it subsided; however, later, at approximately 1:00 a.m., she was  awakened by a recurrence.  She went on to develop nausea and vomiting as  well as chills and was brought to the hospital for further evaluation.  At the time of her admission, she did not complain of any abdominal  pain.   For past medical history, please see that dictated by Dr. Michaelyn Barter.   HOSPITAL COURSE:  1. Cholangitis, common bile duct obstruction, cholecystitis:  The      patient's liver enzymes were found to be significantly elevated      with a total bilirubin of 3.5, direct bilirubin of 2.1, and an alk      phos of 428, SGOT 1720, and an SGPT of 1029.  The patient underwent      an ultrasound of her abdomen on Feb 01, 2007.  It revealed mild      thickening of the gallbladder wall without a Murphy's sign or      stone, moderate dilatation of the common bile duct and pancreatic      duct, a distal stone or obstruction cannot be excluded.  Diffuse      fatty infiltration of the liver was also noted.  A CT scan of the      patient's chest with angiographic contrast was done on Feb 01, 2007.  It revealed no pulmonary emboli but found fluid  around the      gallbladder and extra hepatic bile ducts.  Findings concerning for      inflammatory process such as cholecystitis or even duodenitis.  At      that time, an ultrasound was recommended.  The patient was started      on Zosyn empirically.  Gastroenterology was consulted.  The      following day, an ERCP was attempted by Dr. Rob Bunting.  Dr.      Christella Hartigan indicated that the procedure failed secondary to the patient      having a very small major papilla.  It was recommended after a      second attemptted  ERCP completed by Dr. Melvia Heaps that a      laparoscopic cholecystectomy be performed.  General surgery was      consulted.  On Feb 06, 2007, Dr. Carolynne Edouard performed a laparoscopic      cholecystectomy with intraoperative cholangiogram and open common      bile duct exploration with the placement of a T-tube.  Patient      appears to have tolerated the procedure well.  The patient has not      complained of any significant pain with regards to her abdomen      since her admission to the hospital.  By day #2, the patient was no      longer complaining of any back pain.   1. Transaminitis:  Again, this  is most likely secondary to gallstone      obstructing the common bile duct.  Following the patient's      admission into the hospital and the initiation of Zosyn, the      patient's liver enzymes gradually began to decline and have      continued to do so throughout the course of her hospitalization.   1. Bacteremia secondary to E. coli:  Blood cultures were completed and      confirmed the presence of E. coli and the patient's blood on Feb 01, 2007.  Sensitivities were reported to Zosyn; therefore, this      antibiotic has continued.  The patient has had a normal white count      as well as been afebrile throughout her hospitalization.   1. Arrhythmia:  It was noted that the patient had a 20-beat run of SVT      at approximately 1:00 a.m. on Feb 03, 2007.  She also experienced      frequent PACs but otherwise appeared to be asymptomatic.  A 2D      echocardiogram was completed on Feb 03, 2007.  It revealed overall      left ventricular systolic function to be normal, left ventricular      EF was estimated to be 55%.  Cardiology was consulted in light of      the patient's being prepped for surgery.  They indicated on May 23      when they first saw the patient that her arrhythmia was mild and      transient.  She was not tolerating significant beta blockers due to      sinus bradycardia at rest.  Pindolol was substituted for metoprolol      to gain some intrinsic sympathomimetic activity.  No further      cardiology testing was deemed necessary after that; however,      cardiology has continued to follow the patient throughout the  course of her hospitalization.   1. Hypokalemia:  This was supplemented over the course of the      patient's hospitalization.   1. Hypoalbuminemia:  There may be a component of protein calorie      malnutrition to this.  This may be further addressed once the      patient's diet is advanced.   1. The patient currently has both a J-P drain  placed and a T-tube      present.   1. Back pain:  Again, since the patient's admission into the hospital,      she has not complained of any significant back pain.  X-rays of the      patient's lumbar and thoracic spine were completed on Feb 01, 2007.      A four-view x-ray of the patient's lumbar spine revealed no acute      findings in the lumbar area.  X-rays of the thoracic spine revealed      a mild compression fracture of T10, age indeterminate, but given      the degenerative changes, the      radiologist indicated that he favored it to be an old compression      fracture.  No further workup has taken place.  Actually, this      situation has not yet been discussed with the patient in light of      all of the other medical issues that have had to be addressed up      until this point.      Michaelyn Barter, M.D.  Electronically Signed     OR/MEDQ  D:  02/08/2007  T:  02/08/2007  Job:  [id number]

## 2011-01-26 NOTE — Discharge Summary (Signed)
NAME:  [name] [name], [name] [name]                ACCOUNT NO.:  [id number]   MEDICAL RECORD NO.:  [id number]          PATIENT TYPE:  INP   LOCATION:  5735                         FACILITY:  [facility]   PHYSICIAN:  Ollen Gross. Vernell Morgans, M.D. DATE OF BIRTH:  June 03, 1926   DATE OF ADMISSION:  03/09/2007  DATE OF DISCHARGE:  03/11/2007                               DISCHARGE SUMMARY   Ms. Hodder is a 77 year old white female who had had a cholecystectomy  and common bile duct exploration about a month earlier.  She had a T-  tube in.  She had been having significant back pain and nausea and  vomiting.  She had a T-tube cholangiogram that showed no leak and so in  the clinic on March 09, 2007, her T-tube was removed.  At that time, a  moderate amount of bile fluid drained from the site and at that point,  we were concerned that she could still have a bile leak that was not  demonstrated by the cholangiogram.  Given her amount of pain, we  admitted her to the hospital for IV hydration and pain control.   She underwent a CT scan which did not show any significant fluid  collection near the bile duct.  She did appear to have some retained  common duct stones.  Her T-tube was not removed in the clinic, but she  was admitted to hospital and T-tube cholangiogram showed no evidence of  leak, but she did seem to have some retained common duct stones, so GI  was consulted to help with this.  She improved with hydration and her  LFTs and white count were normal, so she was allowed to be discharged  home with followup to GI for planning to try to repeat an ERCP to remove  her common duct stones.      Ollen Gross. Vernell Morgans, M.D.  Electronically Signed     PST/MEDQ  D:  04/27/2007  T:  04/28/2007  Job:  [id number]

## 2011-01-26 NOTE — Op Note (Signed)
NAME:  [name] [name], DINO                ACCOUNT NO.:  [id number]   MEDICAL RECORD NO.:  [id number]          PATIENT TYPE:  INP   LOCATION:  4714                         FACILITY:  [facility]   PHYSICIAN:  Ollen Gross. Vernell Morgans, M.D. DATE OF BIRTH:  1926-05-21   DATE OF PROCEDURE:  DATE OF DISCHARGE:                               OPERATIVE REPORT   PREOPERATIVE DIAGNOSES:  Gallstones and common bile duct stone.   POSTOPERATIVE DIAGNOSES:  Gallstones and common bile duct stone.   PROCEDURES:  Laparoscopic cholecystectomy with intraoperative  cholangiogram and open common bile duct exploration with placement of a  T-tube.   SURGEON:  Dr. Carolynne Edouard.   ASSISTANT:  Dr. Jamey Ripa.   ANESTHESIA:  General endotracheal.   PROCEDURE:  After informed consent was obtained, the patient was brought  to the Operating Room, placed in the supine position on the operating  table.  After adequate induction of general anesthesia, the patient's  abdomen was prepped with Betadine and draped in usual sterile manner.  The area below the umbilicus was infiltrated with 0.25% percent  Marcaine.  A small incision was made with a 15 blade knife.  This  incision was carried down through the skin and subcutaneous tissue  bluntly with the hemostat and [employer] retractors until the linea alba  was identified.  The linea alba was incised with a 15 blade knife and  each side was grasped with Kocher clamps and elevated anteriorly.  The  preperitoneal space was then probed bluntly with a hemostat until the  peritoneum was opened and access was gained to the abdominal cavity.  A  0 Vicryl pursestring stitch was placed in the fascia around the opening.  A sonic cannula was placed through the opening and anchored in place  with the previous 0 Vicryl pursestring stitch.  The abdomen was then  insufflated with carbon dioxide without difficulty.  The patient was  then placed in head-up position, rotated slightly with the right side  up.   The laparoscope was inserted through the sonic cannula and the  right upper quadrant was inspected.  The dome of the gallbladder and  liver readily identified.  There were no real adhesions to the abdominal  wall that could be appreciated.  Next, the epigastric region was  infiltrated with 0.25% Marcaine.  A small incision was made with a 15  blade knife and a 10 mm port was placed bluntly through this incision  into the abdominal cavity under direct vision.  Sites were then chosen  laterally on the right side of the abdomen with placement of 5-mm ports.  Each of these areas were infiltrated with 0.25% Marcaine.  Small stab  incisions were made with a 15 blade knife and 5 mm ports were placed  bluntly through the incisions into the abdominal cavity under direct  vision.  A blunt grasper was placed through the lateral most 5 mm port  and used to grasp the dome of the gallbladder and elevated anteriorly  and superiorly.  Another blunt grasper was placed through the other 5 mm  port and used to retract  on the body and neck of the gallbladder.  A  dissector was placed through the epigastric port and, using the  electrocautery, some omental adhesions were taken down from the body of  the gallbladder as well as using some laparoscopic scissors down near  the duodenum.  Once this was accomplished, the gallbladder was easily  identified.  Using the dissector and electrocautery, the peritoneal  reflection at the gallbladder neck was opened.  Blunt dissection was  then carried out in this area until the gallbladder neck cystic duct  junction was readily identified and a good window was created.  A single  clip was placed on the gallbladder neck.  A small ductotomy was made  just below the clip with the laparoscopic scissors.  A 14-gauge  Angiocath was placed percutaneously through the anterior abdominal wall  under direct vision.  The Reddick cholangiogram catheter was placed  through the Angiocath  and flushed.  The Reddick catheter was then placed  in the cystic duct and anchored in place with a clip.  A cholangiogram  was obtained that did show two filling defects in the distal common bile  duct.  We were unable to feed the Reddick catheter down into the common  bile duct because of the valves inside the cystic duct.  Attempts were  made at laparoscopically placing the choledochoscope down the cystic  duct, but again we were unable to feed it due to the valves.  At this  point. the Angiocath and catheters were removed from the patient.  Two  clips were placed proximally on the cystic duct and the duct was divided  with two sets of clips.  The gallbladder was then detached from the  liver bed using the hook electrocautery without difficulty.  The  gallbladder was placed up over the liver.  At this point, a right  subcostal incision was then made with a 10 blade knife.  This incision  was carried down through the skin and subcutaneous tissue sharply with  electrocautery.  The fascia of the anterior abdominal wall and muscles  of the abdominal wall were also divided sharply with the electrocautery.  The peritoneum was also opened with the electrocautery thereby allowing  [place] access to the abdominal cavity and the rest of the incision was  opened under direct vision.  A Bookwalter retractor was deployed.  The  common duct was dilated and easily visualized on the anterior surface.  A site was chosen just below the junction with the cystic duct on the  anterior surface of the common bile duct to open the common bile duct.  Two lateral stay sutures were placed with 4-0 PDS in these areas and a  common ductotomy was made with an 11 blade knife and Potts scissors.  Attempts were made to pass Fogarty catheters and curved forceps down the  common duct to retrieve the stones.  We were able to get some debris  out, but no well-defined stone.  We also fed the choledochoscope down there and were  unable to identify a distinct stone.  I believe when we  passed the forceps the patient had very soft stones that were broken up  into debris and we were able to then flush the debris out.  At this  point, a T-tube with a 20-French stem, 16-French T was chosen and the  limbs were cut to an appropriate size.  The back wall of the T was  removed sharply with scissors.  The T-tube was  then placed into the  common duct with the short end superior and longer end inferior.  The  ductotomy was then closed with interrupted 4-0 PDS sutures so that the  closure was fairly snug against the T-tube.  A cholangiogram was then  obtained through this T-tube that showed no more filling defects in the  common duct and good emptying into the duodenum.  The T-tube was then  brought through one of the lateral most 5 mm ports using a tonsil clamp  and was placed a bag drainage and another stab incision was made just  inferior to this for a 19-French round Blake drain which was also  brought through the abdominal wall with a tonsil clamp and the drain was  placed in the gallbladder bed just lateral to the T-tube.  This  operative site was then examined and irrigated and found to be  completely hemostatic.  The gallbladder was retrieved and sent to  pathology.  The two drain tubes were then anchored to the skin with 3-0  nylon stitches.  The wound was then closed with two layers of #1 running  PDS suture.  Each layer was irrigated and the skin was all closed with  staples.  Sterile dressings were applied.  The patient tolerated the  procedure well.  At the end of the case, all needle, sponge and  instrument counts were correct.  The patient was then awakened, taken to  Recovery in stable condition.      Ollen Gross. Vernell Morgans, M.D.  Electronically Signed     PST/MEDQ  D:  02/06/2007  T:  02/06/2007  Job:  [id number]

## 2011-01-26 NOTE — H&P (Signed)
NAME:  [name] [name], [name] [name] NO.:  [id number]   MEDICAL RECORD NO.:  [id number]          PATIENT TYPE:  EMS   LOCATION:  MAJO                         FACILITY:  [facility]   PHYSICIAN:  Michaelyn Barter, M.D. DATE OF BIRTH:  May 30, 1926   DATE OF ADMISSION:  02/01/2007  DATE OF DISCHARGE:                              HISTORY & PHYSICAL   PRIMARY CARE PHYSICIAN:  Dr. Clarene Duke of [place], [place], therefore  she is unassigned.   GASTROENTEROLOGIST:  Dr. Victorino Dike.   CHIEF COMPLAINT:  Back pain.   HISTORY OF PRESENT ILLNESS:  [name] [name] is a 77 year old female with a  past medical history of hypertension and diabetes mellitus who states  that she developed mid region lower back pain approximately 2 night ago.  She described the pain as being just below her shoulder blades. She  characterizes it as 10/10. She states that she does have a history of  back pain and was treated for it approximately a year or two ago and she  states that the pain feels very similar. The pain had been constant up  until yesterday morning at which time it subsided. However this morning  at approximately 1:00 a.m., she was awakened by a reoccurrence of the  back pain. She has also developed nausea and emesis. She complains of  some chills but denies fevers. She had a loose bowel movement this  morning. She denies having any abdominal pain.   PAST MEDICAL HISTORY:  1. Hypertension.  2. History of intraductal papilloma removed from the left breast in      2004.  3. Colonic diverticulosis.  4. Diabetes mellitus.  5. Thoracic back pain secondary to T7-8 radiculopathy. The patient has      received epidural injections in the past.  6. Old compression fracture at T10 level as well as T10-11 disk      protrusion.  7. Right lower lobe pulmonary nodule.   PAST SURGICAL HISTORY:  1. Decompression of the right median nerve at the wrist as well as      decompression of the right ulnar nerve at the  elbow secondary to      carpal tunnel syndrome involving the right hand as well as ulnar      neuropathy.  2. Decompression left ulnar nerve of the left elbow completed February 23, 2006 by Dr. Cindee Salt.  3. Left eye corneal transplant.  4. Intraductal papilloma removed from the left breast in 2004 via      excisional biopsy.   ALLERGIES:  No known drug allergies.   CURRENT MEDICATIONS:  1. Prilosec.  2. Hydrochlorothiazide.  3. Potassium chloride.  4. Metoprolol.  5. Metformin.  6. Multivitamin.  7. Aspirin.  8. Citracal.  9. Omega 3.  These are all take in the morning.   In the evening the patient takes:  1. Metoprolol.  2. Citracal.  3. Enablex.  4. Valtrex.  5. Vitamin D.   SOCIAL HISTORY:  Alcohol the patient denies. Cigarettes the patient  denies.   FAMILY HISTORY:  Mother had a  history of hypertension. Father medical  history is unknown. The patient's sister has a history of colon cancer.   REVIEW OF SYSTEMS:  As per HPI otherwise all other systems are negative.   PHYSICAL EXAMINATION:  GENERAL:  The patient is awake, she is  cooperative, she is actively having emesis consisting of large amounts  of clear phlegm as I attempt to interview her.  VITAL SIGNS:  Temperature was 101.6, blood pressure 135/59, heart rate  83, respirations 24, O2 sat 97%.  HEENT:  Normocephalic, atraumatic. Extraocular movements intact.  Decreased pupil response to light bilaterally.  NECK:  Supple, no JVD, no thyromegaly.  CARDIAC:  S1, S2 present. Regular rate and rhythm.  RESPIRATORY:  No crackles or wheezes.  ABDOMEN:  Flat, soft. There is some vaguely described tenderness across  the left lower and mid lower region of the patient's abdomen. The liver  edge is slightly palpable. There is no rebound, there is no guarding.  The patient denies having any tenderness to palpation of the right upper  quadrant of her abdomen.  EXTREMITIES:  No leg edema.  NEUROLOGIC:  The patient  is alert and oriented x3. Cranial nerves II-XII  are intact. The patient has decreased pupillary constriction to light.  5/5 upper and lower extremity strength.   LABORATORY DATA:  The patient's bilirubin total is 3.5, bilirubin direct  2.1 and direct bilirubin 1.4. Alkaline phosphatase is 128, SGOT 1720.  SGPT 1029. Total protein 6.0, albumin 3.2. Urinalysis is negative. CBC,  white blood cell count of 5.8, hemoglobin 13.7, hematocrit 40.3,  platelet count 125.   The patient had an ultrasound of her abdomen completed which revealed  moderate dilation of the common bile duct and pancreatic duct. Cannot  exclude distal obstruction. Diffuse fatty infiltration of the liver, 1.9  cm simple cyst of the lower pole of the left kidney.   CT scan of the patient's chest with angiographic contrast revealed no  PE. Fluid around the gallbladder and common bile duct concerning for  inflammation and cholecystitis. The gallbladder is not completely imaged  and could be evaluated with ultrasound. A stable 12-mm nodule in the  right lower lobe with new areas of atelectasis or nodularity.   Chest x-ray revealed COPD, no active cardiopulmonary disease.   ASSESSMENT/PLAN:  1. Acute cholecystitis:  The patient's current complaints are somewhat      atypical in that she does not complain of any abdominal pain at the      present time. However, her back pain may be associated with acute      cholecystitis. Given the fact that the patient has an ultrasound as      well as a CAT scan and liver enzymes that are very elevated, one      has to be concerned that the acute cholecystitis may be      contributing to the patient's current back pain. Will check an      amylase and lipase and will also consult gastroenterology. The      patient may need an ERCP. In addition will also continue p.r.n.      pain medications for now. 2. Back pain:  This may be secondary to acute cholecystitis. Will      however consider  further workup i.e. other imaging studies of the      patient's back after the issues involving her gallbladder and      common bile duct are fully addressed. Will again continue p.r.n.  pain medication for now.  3. Transaminitis:  This may be secondary to a blockage within the      common bile duct. Again will discuss this with gastroenterology. I      suspect that the patient may need an endoscopic retrograde      cholangiopancreatography and possibly a cholecystectomy at some      point in the future.  4. Hypoalbumin:  This may be secondary to liver disease and/or protein      calorie malnutrition. Will check a prealbumin level for now.  5. History of hypertension. This currently appears to be stable. Will      resume the patient's prior home medications.  6. History of diabetes mellitus:  Will initiate Accu-Cheks for now.      Will hold the patient's metformin in light of her being made n.p.o.      and will monitor her sugars closely.  7. Nausea and vomiting:  Will start Zofran on a p.r.n. basis.  8. Gastrointestinal prophylaxis:  Will provide Protonix.      Michaelyn Barter, M.D.  Electronically Signed     OR/MEDQ  D:  02/01/2007  T:  02/01/2007  Job:  [id number]

## 2011-01-29 NOTE — H&P (Signed)
NAME:  [name] [name], [name] [name]                ACCOUNT NO.:  [id number]   MEDICAL RECORD NO.:  [id number]          PATIENT TYPE:  INP   LOCATION:  1417                         FACILITY:  [facility]   PHYSICIAN:  Marcie Mowers, M.D.DATE OF BIRTH:  July 02, 1926   DATE OF ADMISSION:  07/18/2005  DATE OF DISCHARGE:                                HISTORY & PHYSICAL   The patient is unassigned   CHIEF COMPLAINTS:  Sudden onset of upper back pain for 3 days, nausea,  weakness, and unable to walk.   HISTORY OF PRESENT ILLNESS:  This is a 77 year old Caucasian female who was  in her usual state of health approximately 2 days ago when she started a  sudden onset of pain in her upper back. The pain has been constant since  then, approximately 8/10 severity, no aggravating or relieving factors. At  the same time the patient became nauseated and had several episodes of  emesis on that particular day and one episode of emesis yesterday. Three  days ago the patient did go to her primary care physician and was given  Phenergan and tramadol for her back pain. The patient has been unable to  take anything p.o. for the last 3 days, has been feeling weak, unable to  walk because my legs give away.   The patient denies any headaches, visual changes, any recent sore throat or  rhinorrhea; she does not have any cough. She does not have any chest pain or  shortness of breath. She denies any abdominal pain, change in her bowel  habits. There is no dysuria, urgency, or frequency of urination. She does  mention that, because of nausea and vomiting, her appetite has been low for  the last 3 days. She denies any weight loss.   PAST MEDICAL HISTORY:  1.  Hypertension.  2.  The patient has had a history of intraductal papilloma removed from left      breast in 2004. This was noticed on her mammogram as calcifications, and      then the excision biopsy was done.  3.  The patient had a colonoscopy in 2004 which revealed  extensive left      colonic diverticulosis.   The patient does not have diabetes, and she does not have any cardiac  history.   The patient does not have any history of surgeries in the past.   MEDICATIONS:  1.  Hydrochlorothiazide 25 mg p.o. daily.  2.  Aspirin 81 mg daily.  3.  Multivitamins.  4.  Citracal.  5.  For the last 3 days, the patient has been on p.r.n. tramadol and      Phenergan.   ALLERGIES:  No known drug allergies.   SOCIAL HISTORY:  The patient does not smoke or drink alcohol or use any  illicit drugs. She is very active, overall healthy, elderly female. She  lives by herself and performs all her activities of daily living and  instrumental activities of daily living independently. She drives. She has  her close family living in the area. At the current time, her daughter is  by  the bedside.   FAMILY HISTORY:  There is no cardiac history in her family. No diabetes in  her family. There is a history of colon cancer in her sister.   REVIEW OF SYSTEMS:  As mentioned in History of Present Illness.   PHYSICAL EXAMINATION:  GENERAL APPEARANCE:  This is a pleasant Caucasian  female who does not appear to be in acute distress.  VITAL SIGNS: Her temperature is 98.1 degrees. Her blood pressure is 125/61  lying.  Her blood pressure decreased to 79/45 in the standing position, and  her heart rate went up to 112 per minute from 90 per minute indicating  positive orthostatic changes.  Respirations are 20 per minute.  She is saturating 96% on room air.  HEENT: Her head is atraumatic; pupils are equally reactive to light;  extraocular muscles are intact; sclerae are nonicteric. Her oral mucosae  appear dry. Pharynx is without any exudates or erythema.  NECK:  Supple. There is no jugular venous distension, no lymphadenopathy, no  thyromegaly, no carotid bruits bilaterally.  CARDIOVASCULAR:  S1-S12 normal, no murmurs, regular rate and rhythm.  CHEST: There is no palpation  tenderness anteriorly.  PULMONARY:  Clear to auscultation bilaterally, no adventitious sounds.  BACK: Examination reveals the presence of palpation tenderness in the lower  thoracic area. There is some point tenderness at approximately T9-T10 area.  ABDOMEN:  Is soft, nondistended, nontender. There is no hepatosplenomegaly.  Bowel sounds are normally active in all quadrants.  EXTREMITIES: Are without any cyanosis, clubbing or edema.  NEUROLOGIC: Her cranial nerves II-XII are intact. Motor strength is 5/5.  Deep tendon reflexes are 2+ all over. Her plantars are downgoing  bilaterally. Cerebellar signs are negative. Because the patient is feeling  very weak, I could not assess her gait. Overall, the neurological  examination seems to be without any deficit.   Labs show white count is 10,400 with 84% neutrophils, 11% lymphocytes and 5%  monocytes. Hemoglobin is 15.8; hematocrit is 45, platelet count of 211,000.  Her sodium is low at 127. Potassium is low at 3.1, chloride 87, CO2 27, BUN  31, and creatinine 1.2, with blood glucose of 147. Total protein is 6.7, AST  23, ALT 25, albumin 2.2, alkaline phosphatase 80, total bilirubin 0.9.  Urinalysis is within normal limits. First set of cardiac enzymes is pending  at this time.   Radiology data, A CT scan of her chest is negative for aortic dissection or  PE. It does show an incidental nodule; this nodule is 12 mm, solid, and  lobulated, and it is in the posterior base of her right lower lobe.   A 12-lead EKG is normal sinus rhythm. Comparison is noted with her old EKG  from 2004, and there is new appearance of lateral T-wave inversions.   ASSESSMENT AND PLAN:  1.  Sudden onset upper back pain, and an abnormal EKG. At this time I will      admit the patient to telemetry, start her on aspirin and low-dose beta      blockers. I will cycle her cardiac enzymes as well. I will get a     followup 12-lead EKG. At this time, the significance of this  upper back      pain is unclear. I am not sure if the abnormal EKG and the back pain are      related.  2.  Hyponatremia. I will start IV hydration with normal saline. Again, the  exact etiology of this hyponatremia is unclear. I will get urine      osmolarity, plasma osmolarity, and urine sodium. Given that the patient      is dehydrated, higher sodium is expected, and this value of 127 is on      the lower side. I will also get a TSH.  3.  Hypokalemia.  Will replete IV as well as p.o.  4.  Incidental finding of a 12 mm nodule on CT scan of the chest. The      patient is a nonsmoker, and she does not have any secondary exposure to      smoke. An attempt can be made to obtain an old x-ray or a CT scan if she      has had in the past. We will look in detail the appearance of this      nodule and how significant it can be as far as malignancy.  5.  Intractable back pain, 3 days of duration. The patient has this new-      onset back pain, and please see problem #1. I will also get plain x-rays      of the thoracolumbar spine. We will start her on analgesics.  6.  For prophylaxis of deep vein thrombosis, I will put her on Lovenox.  7.  For peptic ulcer disease prophylaxis, will start Protonix.           ______________________________  Marcie Mowers, M.D.     PMJ/MEDQ  D:  07/18/2005  T:  07/18/2005  Job:  [id number]

## 2011-01-29 NOTE — Discharge Summary (Signed)
[name] [name], [name] [name]                ACCOUNT NO.:  [id number]   MEDICAL RECORD NO.:  [id number]          PATIENT TYPE:  INP   LOCATION:  1417                         FACILITY:  [facility]   PHYSICIAN:  Elliot Cousin, M.D.    DATE OF BIRTH:  06-17-1926   DATE OF ADMISSION:  07/18/2005  DATE OF DISCHARGE:  07/30/2005                                 DISCHARGE SUMMARY   DISCHARGE DIAGNOSES:  1.  Thoracic back pain secondary to T7-T8 radiculopathy per MRI of the      thoracic spine on November5,2006, status post epidural injection per Dr.      Letitia Caul on July 29, 2005.  2.  Old compression fracture at the T10 level and T10-T11 disk protrusion      per MRI of the thoracic spine on November5,2006.  3.  Incidental finding of right lower lobe pulmonary nodule (12 mm in      diameter) per CT scan of the chest November5,2006. THE PATIENT WILL NEED      A FOLLOW-UP CT SCAN OF THE CHEST IN 2-3 MONTHS.  4.  Mildly elevated troponin-I with normal CK. The patient's ejection      fraction ranges between 55 and 65% per 2-D echocardiogram      November7,2006.  5.  Type 2 diabetes mellitus, new diagnosis.  6.  Hypertension.  7.  Hypokalemia secondary to hydrochlorothiazide.  8.  Transient dysphasia, resolved during the hospital course.   SECONDARY DISCHARGE DIAGNOSES:  1.  History of left breast intraductal papilloma, status post excision in      2004.  2.  Extensive left colonic diverticulosis per colonoscopy in 2004.   DISCHARGE MEDICATIONS:  1.  Metformin 500 mg 1 tablet daily.  2.  Hydrochlorothiazide 25 mg daily.  3.  Lopressor 25 mg 1/2 a tablet b.i.d.  4.  Vicodin 5 mg every 4 hours as needed for pain.  5.  MiraLax laxative or substitute daily.  6.  Potassium chloride 20 mEq daily (take with HCTZ).  7.  Multivitamin with iron once daily.  8.  Aspirin 81 mg daily.  9.  Ambien 5 mg q.h.s. p.r.n.   CONSULTATIONS:  1.  Neurosurgeon, Dr. Wynetta Emery.  2.  Interventional radiologist, Dr. Letitia Caul.   PROCEDURE PERFORMED:  1.  CT scan of the chest on November5,2006. The results revealed no acute      findings. A 12 mm solid nodule in the posterior right lower lobe.      Chronic lung disease with scattered areas of scarring.  2.  MRI of the thoracic spine. The results revealed old healed superior      endplate compression fracture at T10. No acute fracture. Moderate-sized      posterior lateral disk protrusion on the right at T7-T8, T7 nerve root      encroachment. There is a smaller more broad-based disk protrusion on the      right at T10-T11. Although both of these mildly deform the cord, there      is no cord compression. Right lower lobe pulmonary nodule as      demonstrated on  recent CT of the chest.  3.  T7-T8 epidural injection per Dr. Merceda Elks on November16,2006.  4.  2-D echocardiogram on November7,2006. The results revealed normal left      ventricular systolic function. Ejection fraction ranged between 55 and      65%. Doppler parameters were consistent with abnormal left ventricular      relaxation. Left atrial size was the upper limits of normal.   HISTORY OF PRESENT ILLNESS:  The patient is a 77 year old lady with a past  medical history significant for hypertension, who presented to the emergency  department on November5,2006 with a chief complaint of upper back pain. The  patient was treated by her primary care physician with Phenergan and  tramadol for nausea and back pain, respectively. However, the pain continued  and the patient presented to the emergency department. Given the severe pain  and transient nausea and limited mobility, the patient was admitted for  further evaluation and management.   HOSPITAL COURSE:  1.  THORACIC BACK PAIN SECONDARY TO T7-T8 RADICULOPATHY.  The patient was      started on Vicodin as needed for pain. For further evaluation, an x-ray      of the thoracic spine was ordered. The x-ray revealed no acute finding,      however, there  was evidence of a remote T11 compression fracture. Given      the persistent back pain, Vicodin was discontinued and the patient was      started on Dilaudid 4 mg as needed. There was a concern regarding a      thoracic aneurysm given the patient's persistent pain. Therefore, a CT      scan of the chest was ordered. The CT scan of the chest revealed no      acute aneurysm, PE or pneumonia. However, it did reveal a 12 mm solid      nodule in the posterior right lower lobe. This was felt to be not      responsible for the patient's pain. However, a close follow-up was      recommended. Subsequently a MRI of the thoracic spine was ordered. The      thoracic spine MRI results revealed a moderate-sized posterior lateral      disk protrusion on the right which deformed the cord and encroached on      the right T7 nerve root. There was also evidence of disk protrusion at      the T10-T11 level. Given these findings, neurosurgeon, Dr. Wynetta Emery was      consulted.   Dr. Wynetta Emery evaluated the patient on November11,2006. He started treatment with  Decadron 10 mg IV q.6 h x4 and then a Medrol Dosepak. Dr. Wynetta Emery also  recommended an epidural intercostally at the T7-T8 level. Therefore,  interventional radiology was consulted for the administration of the  epidural. Following the epidural injection, along with the steroid  treatment, the patient's pain improved but did not totally resolve. Physical  therapy and occupational therapy were ordered. Initially it was felt that  the patient may require SACU  or further rehabilitation at a skilled nursing  facility. However, over the past couple of days, the patient's mobility  increased and improved. Her pain has been under control over the past day or  two. The patient will need further occupational and physical therapy at home  and she appears to not require a SACU  admission. The steroid taper was completed during hospital course. Home health physical therapy,  occupational  therapy and a registered nurse were ordered prior to hospital discharge. A  walker was also ordered for ambulation. The patient was advised to follow up  with Dr. Jeannetta Nap in 5-7 days.   1.  RIGHT LOWER LOBE LUNG NODULE. As stated above, there was an incidental      finding of a 12 mm right lower lobe pulmonary nodule on CT scan of the      chest. The  patient was informed of this. Dr. Arletha Grippe, discussed the      finding with Dr. Jeannetta Nap' PA. The recommendation by the radiologist was      to repeat the CT scan in  a few weeks  or couple of months. Again, Dr.      Jeannetta Nap' PA was informed of this finding and the need to reevaluate the      CT scan of the chest in a few weeks to 2-3 months.   1.  MILDLY ELEVATED TROPONIN I. The patient's EKG on admission revealed      nonspecific ST and T-wave abnormalities. Although the patient had no      overt complaints of chest pain, nitropaste was added empirically.      Cardiac enzymes were ordered for further evaluation and management. The      patient's troponin I initially was 0.02. However, it was repeated at      0.25. Another followup troponin I was 0.03. The 0.25 troponin I result      may have been a mistake. However, a 2-D echocardiogram was ordered for      further evaluation and management. The 2-D echocardiogram revealed an      ejection fraction that ranged between 55 and 65%. There were also      changes consistent with diastolic dysfunction. The patient had no real      complaints of chest pain during the hospital course. She was continued      on aspirin 81 mg daily.   1.  TYPE 2 DIABETES MELLITUS. The patient's capillary blood sugars were      quite elevated following the steroid treatment. The patient was started      on Glucophage, sliding scale insulin regimen, and Lantus. The patient      had no a previous history of diabetes mellitus. However, a hemoglobin      A1c was mildly elevated at 7.2. The patient was provided  with education      regarding diabetes management, glucose monitoring, and diet. She      appeared to understand the instructions well. The Medrol Dosepak was      discontinued at the time of hospital discharge. The  patient did receive      a 60 mg Kenalog epidural which could also exacerbate the patient's      hyperglycemia. However, it was felt that she would not need insulin      therapy at home and she was therefore discharged to home on only      Glucophage/metformin 500 mg daily. More than likely the Glucophage may      be all she needs for treatment however, if her capillary blood sugars      remain elevated, greater than 160 consistently, the Glucophage may need      to be increased to b.i.d. This decision will be deferred to her primary      care physician, Dr. Jeannetta Nap.   1.  HYPERTENSION. The patient was restarted on hydrochlorothiazide 25 mg  daily. Her blood pressures began to increase on hydrochlorothiazide     alone. The increase in blood pressure was thought to be secondary to      pain and steroids. Therefore metoprolol was started at 12.5 mg b.i.d.      The patient's serum potassium was slightly low during the hospital      course secondary to hydrochlorothiazide. She was repleted with potassium      chloride during the hospital course. A TSH was assessed and found to be      within normal limits at 1.108. Her magnesium was within normal limits at      1.7. The patient was discharged to home on a regimen of      hydrochlorothiazide with potassium chloride and metoprolol. Further      management per Dr. Jeannetta Nap.   DISCHARGE LABORATORY DATA:  Sodium 138, potassium 3.7, chloride 100, CO2 31,  glucose 98, BUN 11, creatinine 0.7, calcium 9.2. WBC 7.2, hemoglobin 15.2,  hematocrit 45.1, platelets 210.   DISCHARGE DISPOSITION:  The patient was discharged to home in improved and  stable condition. She was advised to follow up with Dr. Jeannetta Nap in 5-7 days.  She will need to  have a repeat CT scan of the chest to follow up on the 12  mm right lower lobe lung nodule.      Elliot Cousin, M.D.  Electronically Signed     DF/MEDQ  D:  07/30/2005  T:  07/30/2005  Job:  [id number]   cc:   Windle Guard, M.D.  Fax: [phone]   Donalee Citrin, M.D.  Fax: [phone]

## 2011-01-29 NOTE — Discharge Summary (Signed)
NAME:  [name], [name]                ACCOUNT NO.:  [id number]   MEDICAL RECORD NO.:  [id number]          PATIENT TYPE:  INP   LOCATION:  5524                         FACILITY:  [facility]   PHYSICIAN:  Wilson Singer, M.D.DATE OF BIRTH:  [birth year]   DATE OF ADMISSION:  02/01/2007  DATE OF DISCHARGE:  02/14/2007                               DISCHARGE SUMMARY   ADDENDUM:   FINAL DISCHARGE DIAGNOSES:  1. Cholangitis status post cholecystectomy.  2. Escherichia coli bacteremia.  3. Type 2 diabetes mellitus.   CONDITION ON DISCHARGE:  Stable.   MEDICATIONS ON DISCHARGE:  1. Prilosec 20 mg daily.  2. Metformin 500 mg b.i.d.  3. Multivitamins 1 tablet daily.  4. Aspirin 81 mg daily.  5. Enablex daily.  6. Valtrex 500 mg q.h.s.  7. Vitamin D 1 tablet daily.  8. Pindolol 5 mg b.i.d.  9. Lotensin 10 mg daily.   HISTORY:  This 77 year old lady was admitted with right mid back pain.  Please see initial history and physical examination by Dr. Michaelyn Barter.   HOSPITAL PROGRESS:  The patient was seen by the surgeons and underwent a  laparoscopic cholecystectomy for cholangitis.  She seemed to do well  after this.  She also underwent postoperative T-tube cholangiogram, and  this showed two persistent nonobstructive filling defects in the distal  common bile duct, which were likely to be stones.  The contrast flowed  into the duodenum.  This message was conveyed to the surgeons who felt  that she was stable to go home.  On the day of discharge, she had very  minimal right upper quadrant abdominal pain, and she was eating and  drinking well.   PHYSICAL EXAMINATION:  VITAL SIGNS:  Temperature was 97.6, blood  pressure 154/70, pulse 69, saturation 96% on room air.  HEART:  Heart sounds are present and normal without murmurs.  LUNGS:  Lung fields were clear.  ABDOMEN:  Soft and nontender.  There was a drainage tube in situ.   INVESTIGATIONS:  Shows sodium of 141, potassium 3.7,  chloride 108,  bicarbonate 29, BUN 3, creatinine 0.54.  Hemoglobin 11.1, white blood  cell count 5.6, platelets 359.   FURTHER DISPOSITION:  The patient will go home and will be followed up  by the surgeons in 1 to 2 weeks.      Wilson Singer, M.D.  Electronically Signed     NCG/MEDQ  D:  03/28/2007  T:  03/29/2007  Job:  [id number]

## 2011-01-29 NOTE — Op Note (Signed)
NAME:  [name] [name], [name] [name]                          ACCOUNT NO.:  [id number]   MEDICAL RECORD NO.:  [id number]                   PATIENT TYPE:  AMB   LOCATION:  DSC                                  FACILITY:  [facility]   PHYSICIAN:  Gita Kudo, M.D.              DATE OF BIRTH:  [birth year]   DATE OF PROCEDURE:  09/26/2002  DATE OF DISCHARGE:                                 OPERATIVE REPORT   OPERATIVE PROCEDURE:  Excisional biopsy, left breast lesions (2).   SURGEON:  Gita Kudo, M.D.   ANESTHESIA:  MAC - I.V. sedation, local 1% Xylocaine.   PREOPERATIVE DIAGNOSES:  1. Probable intraductal papilloma, left breast.  2. Left breast calcification.   POSTOPERATIVE DIAGNOSES:  1. Probable intraductal papilloma, left breast.  2. Left breast calcification.  3. Pending pathology, lesion removed per specimen mammogram.   CLINICAL SUMMARY:  A 77 year old female with calcification in her left  breast and also an area of nipple discharge.  Ductogram obtained showed  probable intraductal papilloma, and she comes in for excision.   OPERATIVE FINDINGS:  The patient had previously done needle localization and  methylene blue ductogram.  I removed the tissue around and wide to the wire,  from the entrance of the wire medially over to the nipple per Radiology  instruction.   OPERATIVE PROCEDURE:  Under satisfactory intravenous sedation, the patient  was positioned, prepped and draped in a standard fashion.  Next, 1%  Xylocaine was infiltrated throughout for good analgesia.  The wire entered  the left breast medially at the 9 o'clock position and extended straight to  the nipple.  Per the radiologist's suggestion, an elliptical incision was  made around the wire, and the incision then continued to the nipple.  Then,  cautery was used to develop slight flaps, and the lesions were excised.  Both scalpel and cautery dissection were used to remove a large amount of  breast tissue.  To be sure  that I got the abnormality, I went deep to the  wire and also beyond the wire medially, assuming I had removed the abnormal  calcification.  Likewise, I saw some blue in the specimen and went directly  under the nipple, transecting the breast tissue there and saw no blue  elsewhere, feeling that I got the second lesion.  The specimen was marked  with suture and the wire left in place and sent for mammogram.   The wound meanwhile was lavaged with saline, made dry by cautery, and  reconstructed with 3-0 Vicryl and skin edges approximated with 4-0 nylon.  Sterile absorbent compressive dressing was applied when the Radiology tech  told us that we had removed the abnormal tissue, in her opinion.  The  patient will return as an outpatient for follow-up.  Gita Kudo, M.D.    MRL/MEDQ  D:  09/26/2002  T:  09/26/2002  Job:  [id number]   cc:   Gretta Cool, M.D.  [address] [place]  [place] [address]  Fax: [phone]

## 2011-01-29 NOTE — Op Note (Signed)
NAME:  [name] [name], [name] [name]                ACCOUNT NO.:  [id number]   MEDICAL RECORD NO.:  [id number]          PATIENT TYPE:  AMB   LOCATION:  DSC                          FACILITY:  [facility]   PHYSICIAN:  Cindee Salt, M.D.       DATE OF BIRTH:  Jan 28, 1926   DATE OF PROCEDURE:  07/06/2006  DATE OF DISCHARGE:                                 OPERATIVE REPORT   PREOPERATIVE DIAGNOSIS:  Carpal tunnel syndrome, right hand; ulnar  neuropathy, right elbow.   POSTOPERATIVE DIAGNOSIS:  Carpal tunnel syndrome, right hand; ulnar  neuropathy, right elbow.   OPERATION:  Decompression right median nerve at the wrist, decompression  right ulnar nerve at the elbow.   SURGEON:  Cindee Salt, M.D.   ASSISTANT:  Carolyne Fiscal R.N.   ANESTHESIA:  General.   HISTORY:  The patient is a 77 year old female with a history of carpal  tunnel syndrome, EMG nerve conductions positive with ulnar neuropathy at the  elbow, EMG nerve conductions positive also.  This has not responded to  conservative treatment.  She is desirous of proceeding to have release  performed.  She is aware of risks and complications, that there is no  guarantee with surgery, possibility of infection, recurrence, injury to  arteries, nerves, tendons, incomplete relief of symptoms, dystrophy.  In the  preoperative area, the extremity is marked by the patient and surgeon,  questions encouraged and answered.   PROCEDURE:  The patient was brought to the operating room where general  anesthetic was carried out without difficulty.  She was prepped and draped  using DuraPrep, supine position with the right arm free.  After 3-minute dry  time she was draped.  The limb was exsanguinated with an Esmarch bandage.  Tourniquet placed high on the arm was inflated to 250 mmHg.  A straight  incision was made in the palm and carried down through subcutaneous tissue.  Bleeders were electrocauterized.  Palmar fascia was split, superficial  palmar arch identified, flexor  tendon to the ring and little finger  identified, to the ulnar side of the median nerve.  Carpal retinaculum was  incised with sharp dissection.  A right-angle and Sewall retractor were  placed between skin and forearm fascia.  The fascia was released for  approximately 1.5 cm proximal to the wrist crease under direct vision.  Canal was explored.  No further lesions were identified.  Tenosynovial  tissue was moderately thickened.  The wound was irrigated.  The skin closed  with interrupted 5-0 nylon sutures.  A separate incision was then made over  the medial epicondyle with the arm, right elbow, carried down through  subcutaneous tissue.  Bleeders again electrocauterized.  Dissection carried  down to Osborne's fascia.  The posterior branch of the medial antebrachial  cutaneous nerve of the forearm was identified and protected.  Retractors  were placed.  Osborne's fascia was released.  The release was then performed  to the posterior fascia, leaving the intermuscular septum intact up to the  arcade of Struthers.  No further lesions were identified, compressing the  nerve proximally.  A fasciotomy  was then performed of flexor carpi ulnaris.  A release performed protecting the motor branches and no further lesions  were noted distally.  The arm was placed through a full range motion, no  subluxation to the nerve was noted.  The wound was irrigated.  The  subcutaneous tissue was closed with interrupted 4-0 Vicryl sutures and the  skin with a subcuticular 4-0 Monocryl suture.  Steri-Strips were  applied.  Sterile compressive dressing, long-arm splint applied with the  fingers free.  The patient tolerated the procedure well and was taken to the  recovery observation in satisfactory condition.  She will be discharged home  to return to the [facility] in 1 week on Vicodin.           ______________________________  Cindee Salt, M.D.     GK/MEDQ  D:  07/06/2006  T:  07/07/2006   Job:  [id number]   cc:   Aida Puffer

## 2011-01-29 NOTE — Op Note (Signed)
   NAME:  [name] [name], [name] [name]                          ACCOUNT NO.:  [id number]   MEDICAL RECORD NO.:  [id number]                   PATIENT TYPE:  AMB   LOCATION:  ENDO                                 FACILITY:  [facility]   PHYSICIAN:  Danise Edge, M.D.                DATE OF BIRTH:  1926-01-18   DATE OF PROCEDURE:  04/18/2003  DATE OF DISCHARGE:                                 OPERATIVE REPORT   OPERATION:  Colonoscopy.   OPERATIVE INDICATIONS:  Ms. Karysa Heft is a 77 year old female born Apr 07, 1926.  Ms. Reali is scheduled to undergo her first screening colonoscopy  with polypectomy to prevent colon cancer.  Her sister was recently diagnosed  with rectal cancer and has a colostomy.   ENDOSCOPIST:  Charolett Bumpers, M.D.   PREMEDICATION:  Versed 10 mg, Demerol 50 mg.   PROCEDURE:  After obtaining confirmed consent, Ms. Maldonado was placed in the  left lateral decubitus position.  I administered intravenous Demerol and  intravenous  Versed to achieve conscious sedation for the procedure.  The  patient's blood pressure, oxygen saturation, and cardiac rhythm were  monitored throughout the procedure and documented in the medical record.  Anal inspection was normal.  Digital rectal exam was normal.  The Olympus  adjustable pediatric video colonoscope was introduced into the rectum and  advanced to the cecum.  Colonic preparation for the exam today was  excellent.   Rectum normal.  Sigmoid colon and descending colon:  Extensive left colonic diverticulosis.  At 60 cm from the anal verge, a 4 mm sessile polyp was removed with the  electrocautery snare and submitted for pathologic interpretation.  Splenic flexure normal.  Transverse colon normal.  Hepatic flexure normal.  Ascending colon:  From the proximal ascending colon, a 2 mm sessile polyp  was removed with cold biopsy forceps and submitted for pathologic  interpretation.  Cecum and ileocecal valve normal.   ASSESSMENT:  1.  Extensive left colonic diverticulosis.  2.     A small polyp was removed from the proximal ascending colon and submitted     for pathologic interpretation; a 4 mm polyp was removed from the proximal     sigmoid colon and submitted for pathologic interpretation.   RECOMMENDATIONS:  Repeat colonoscopy in five years.                                               Danise Edge, M.D.    MJ/MEDQ  D:  04/18/2003  T:  04/18/2003  Job:  [id number]   cc:   Windle Guard, M.D.  [address]  [place], [place] [address]  Fax: [phone]

## 2011-01-29 NOTE — Op Note (Signed)
NAME:  [name] [name], [name] [name]                ACCOUNT NO.:  [id number]   MEDICAL RECORD NO.:  [id number]          PATIENT TYPE:  AMB   LOCATION:  DSC                          FACILITY:  [facility]   PHYSICIAN:  Cindee Salt, M.D.       DATE OF BIRTH:  Jan 14, 1926   DATE OF PROCEDURE:  DATE OF DISCHARGE:                                 OPERATIVE REPORT   PREOPERATIVE DIAGNOSIS:  Carpal tunnel syndrome left hand.  Ulnar  neuropathy, left elbow.   POSTOPERATIVE DIAGNOSIS:  Carpal tunnel syndrome left hand.  Ulnar  neuropathy, left elbow.   OPERATION:  Decompression left median nerve, decompression left ulnar nerve  left elbow.   SURGEON:  Dr. Merlyn Lot.   ASSISTANT:  Carolyne Fiscal, R.N.   ANESTHESIA:  General.   HISTORY:  The patient is a 77 year old female, with a history of carpal  tunnel syndrome, EMG nerve conductions positive and ulnar neuropathy at her  elbow with symptoms both recalcitrant to conservative treatment.  She has  elected to undergo release of each with possible transposition to the ulnar  nerve.  She is aware of risks and complications in the preoperative area,  questions were encouraged and answered.  The site of surgery marked by both  the patient and surgeon.   PROCEDURE:  The patient was brought to the operating room, where a general  anesthetic was carried out without difficulty.  She was prepped using  DuraPrep, supine position, left arm free.  Draping was done after 3 minutes  of dry time to the DuraPrep.  The limb was exsanguinated with an Esmarch  bandage.  Tourniquet placed high on the arm and was inflated to 250 mmHg.  A  straight incision was made longitudinally in the palm and carried down  through subcutaneous tissue.  Bleeders were electrocauterized.  Palmar  fascia was split.  Superficial palmar arch identified.  Flexor tendon of the  ring and little finger identified to the ulnar side of median nerve.  Carpal  retinaculum was incised with sharp dissection.  A right angle  and Sewall  retractor were placed between skin and forearm fascia.  The fascia was  released for approximately a centimeter and a half proximal to the wrist  crease under direct vision.  The canal was explored.  Persistent median  artery was present.  No thrombosis or aneurysm was noted.  No further  lesions were identified.  The wound was irrigated.  Skin was closed with  interrupted 5-0 nylon sutures.  A separate incision was then made over the  medial side of the elbow, carried down through subcutaneous tissue.  Bleeders again electrocauterized.  The posterior branch of the medial  cutaneous nerve of the forearm was identified and protected.  The ulnar  nerve was identified.  A release was then performed with a fasciotomy of  flexor carpi ulnaris tendon muscle belly.  The release proximally throughout  __________  fascia was taken to the arcade of Struthers.  No further lesions  were identified.  Medial intermuscular septum was maintained in full flexion  of the elbow.  The ulnar  nerve did not anteriorly displace.  The wound was  irrigated.  Subcutaneous tissue was closed with interrupted 4-0 Vicryl and  skin with a subcuticular 4-0 Monocryl suture.  Steri-Strips were applied.  Sterile compressive  dressing and long-arm splint applied.  The patient tolerated the procedure  well and was taken to the recovery  for observation in satisfactory  condition.  A splint was placed with the elbow in approximately 30 degrees  of flexion.  She is discharged home to return to __________  [facility] in 1  week on Vicodin.           ______________________________  Cindee Salt, M.D.     GK/MEDQ  D:  02/23/2006  T:  02/23/2006  Job:  [id number]

## 2011-01-29 NOTE — Consult Note (Signed)
NAME:  [name] [name], [name] [name]                ACCOUNT NO.:  [id number]   MEDICAL RECORD NO.:  [id number]          PATIENT TYPE:  INP   LOCATION:  1417                         FACILITY:  [facility]   PHYSICIAN:  Donalee Citrin, M.D.        DATE OF BIRTH:  [birth year]   DATE OF CONSULTATION:  07/24/2005  DATE OF DISCHARGE:                                   CONSULTATION   REASON FOR CONSULTATION:  Mid thoracic back pain with T8 radiculopathy.   HISTORY OF PRESENT ILLNESS:  The patient is a very pleasant 77 year old  female, who was admitted to the hospital on November 5 for interscapular  pain with some occasional radiation across her chest.  This began with no  particular inciting event.  There was no trauma, no fall, nothing to  precipitate it.  It had been going on about a week or so prior to admission.  The patient had had some coughing episodes, and this is the only thing that  might have precipitated the pain, so the patient is brought to the ER to  rule out pneumonia to which this was ruled out.  Currently she complains of  mid thoracic pain with radiation but denies any numbness and tingling in her  legs, denies any bladder difficulty.  Says she has been having a little bit  of constipation in her bowels but otherwise no trouble there, and she has  had some difficulty walking, but this is probably secondary to pain.   PAST MEDICAL HISTORY:  1.  Hypertension.  2.  History of intraductal breast carcinoma papilloma.  3.  Diverticulosis.   ALLERGIES:  She has no medication allergies.   She was admitted to the hospital on hydrochlorothiazide, aspirin,  multivitamins, __________.  She is currently on pain medicine to include  Flexeril and Tylox.   PHYSICAL EXAMINATION:  GENERAL:  The patient is a very pleasant, awake and  alert 77 year old female in no acute distress.  HEENT:  Within normal limits.  EXTREMITIES:  Upper extremity sensation 5/5, lower extremity sensation 5/5  in iliopsoas, quads,  hamstrings, gastrocs and HL.  She has good ambulation.  She does require some assistance, but she has no  evidence of myelopathy.  Reflexes are normal and symmetric.  Toes are  downgoing, and she has no ankle clonus.  She has normal sensation to light  touch and proprioception.   Her MRI scan shows significant disk bulges at T7-8 as well as T10-11.  The  T10-11 one is causing some thoracic stenosis.  T7-8 one is causing a mild to  moderate compression at the right side of her cord, and I do think this is  more referable to her symptomatic level with the radiation she is having  under breasts and I think correlates more to her T7-T8 radicular pattern  than T10-11.  Without any evidence of myelopathy and this being a pain  syndrome, I have recommended IV steroid followed by oral steroid.  Evaluation for possible intercostal nerve root blocks or epidurals prior to  any consideration for surgical intervention of the thoracic spine.  Surgery  in a 77 year old is  obviously fraught with some level of risk, especially having to go  transthoracic, although I think most of her 7-8 herniation is lateral enough  that it could favor a transpedicular approach.  So we will try the IV  steroids and set her up for intercostal blocks and follow her along.           ______________________________  Donalee Citrin, M.D.     GC/MEDQ  D:  07/24/2005  T:  07/24/2005  Job:  [id number]

## 2011-06-29 LAB — I-STAT 8, (EC8 V) (CONVERTED LAB)
BUN: 16
Chloride: 107
Glucose, Bld: 165 — ABNORMAL HIGH
HCT: 41
Operator id: 189501
pCO2, Ven: 38.3 — ABNORMAL LOW

## 2011-06-29 LAB — POCT I-STAT CREATININE
Creatinine, Ser: 0.7
Operator id: 189501

## 2011-06-29 LAB — POCT CARDIAC MARKERS: Operator id: 189501

## 2011-06-30 LAB — COMPREHENSIVE METABOLIC PANEL
ALT: 40 — ABNORMAL HIGH
ALT: 44 — ABNORMAL HIGH
AST: 41 — ABNORMAL HIGH
Albumin: 2.9 — ABNORMAL LOW
Albumin: 3.3 — ABNORMAL LOW
Alkaline Phosphatase: 69
Alkaline Phosphatase: 78
CO2: 27
Chloride: 103
Creatinine, Ser: 0.68
GFR calc Af Amer: >60
GFR calc non Af Amer: >60
Glucose, Bld: 165 — ABNORMAL HIGH
Potassium: 4
Potassium: 4
Sodium: 136
Sodium: 141
Total Bilirubin: 0.8
Total Protein: 6.4

## 2011-06-30 LAB — CBC
Hemoglobin: 12.6
Platelets: 171
Platelets: 185
RBC: 4.15
RDW: 13.6
WBC: 6.3
WBC: 6.8

## 2011-06-30 LAB — DIFFERENTIAL
Basophils Absolute: 0
Basophils Relative: 1
Eosinophils Absolute: 0.1
Eosinophils Absolute: 0.1
Eosinophils Relative: 1
Lymphocytes Relative: 15
Monocytes Absolute: 0.4
Monocytes Absolute: 0.5
Monocytes Relative: 7

## 2011-07-01 LAB — COMPREHENSIVE METABOLIC PANEL
ALT: 35
Albumin: 2.1 — ABNORMAL LOW
Alkaline Phosphatase: 59
Chloride: 108
Glucose, Bld: 127 — ABNORMAL HIGH
Potassium: 3.7
Sodium: 141
Total Protein: 5.2 — ABNORMAL LOW

## 2011-07-01 LAB — HEPATIC FUNCTION PANEL
ALT: 42 — ABNORMAL HIGH
Bilirubin, Direct: 0.2
Indirect Bilirubin: 0.2 — ABNORMAL LOW

## 2011-07-01 LAB — CBC
Hemoglobin: 11.1 — ABNORMAL LOW
Platelets: 359
RDW: 13.3
WBC: 5.6

## 2012-01-05 ENCOUNTER — Other Ambulatory Visit: Payer: Self-pay | Admitting: Gynecology

## 2012-01-05 DIAGNOSIS — Z1231 Encounter for screening mammogram for malignant neoplasm of breast: Secondary | ICD-10-CM

## 2012-01-31 ENCOUNTER — Ambulatory Visit: Payer: Medicare Other

## 2012-02-03 ENCOUNTER — Ambulatory Visit
Admission: RE | Admit: 2012-02-03 | Discharge: 2012-02-03 | Disposition: A | Discharge status: Home / Self Care | Payer: Medicare Other | Source: Ambulatory Visit | Attending: Gynecology | Admitting: Gynecology

## 2012-02-03 DIAGNOSIS — Z1231 Encounter for screening mammogram for malignant neoplasm of breast: Secondary | ICD-10-CM

## 2013-02-06 ENCOUNTER — Other Ambulatory Visit: Payer: Self-pay

## 2013-02-06 DIAGNOSIS — Z1231 Encounter for screening mammogram for malignant neoplasm of breast: Secondary | ICD-10-CM

## 2013-03-09 ENCOUNTER — Ambulatory Visit
Admission: RE | Admit: 2013-03-09 | Discharge: 2013-03-09 | Disposition: A | Discharge status: Home / Self Care | Payer: Medicare Other | Source: Ambulatory Visit

## 2013-03-09 DIAGNOSIS — Z1231 Encounter for screening mammogram for malignant neoplasm of breast: Secondary | ICD-10-CM

## 2014-02-25 ENCOUNTER — Other Ambulatory Visit: Payer: Self-pay

## 2014-02-25 DIAGNOSIS — Z1231 Encounter for screening mammogram for malignant neoplasm of breast: Secondary | ICD-10-CM

## 2014-03-12 ENCOUNTER — Ambulatory Visit
Admission: RE | Admit: 2014-03-12 | Discharge: 2014-03-12 | Disposition: A | Discharge status: Home / Self Care | Payer: Medicare Other | Source: Ambulatory Visit

## 2014-03-12 DIAGNOSIS — Z1231 Encounter for screening mammogram for malignant neoplasm of breast: Secondary | ICD-10-CM

## 2014-05-15 ENCOUNTER — Emergency Department (HOSPITAL_COMMUNITY): Payer: Medicare Other

## 2014-05-15 ENCOUNTER — Encounter (HOSPITAL_COMMUNITY): Payer: Self-pay | Admitting: Emergency Medicine

## 2014-05-15 ENCOUNTER — Inpatient Hospital Stay (HOSPITAL_COMMUNITY)
Admission: EM | Admit: 2014-05-15 | Discharge: 2014-05-19 | DRG: 445 | Disposition: A | Discharge status: Home / Self Care | Payer: Medicare Other | Attending: Internal Medicine | Admitting: Internal Medicine

## 2014-05-15 DIAGNOSIS — Z79899 Other long term (current) drug therapy: Secondary | ICD-10-CM

## 2014-05-15 DIAGNOSIS — F17200 Nicotine dependence, unspecified, uncomplicated: Secondary | ICD-10-CM | POA: Diagnosis present

## 2014-05-15 DIAGNOSIS — R7402 Elevation of levels of lactic acid dehydrogenase (LDH): Secondary | ICD-10-CM | POA: Diagnosis not present

## 2014-05-15 DIAGNOSIS — R7401 Elevation of levels of liver transaminase levels: Secondary | ICD-10-CM | POA: Diagnosis present

## 2014-05-15 DIAGNOSIS — I10 Essential (primary) hypertension: Secondary | ICD-10-CM | POA: Diagnosis present

## 2014-05-15 DIAGNOSIS — I129 Hypertensive chronic kidney disease with stage 1 through stage 4 chronic kidney disease, or unspecified chronic kidney disease: Secondary | ICD-10-CM | POA: Diagnosis present

## 2014-05-15 DIAGNOSIS — K219 Gastro-esophageal reflux disease without esophagitis: Secondary | ICD-10-CM | POA: Diagnosis present

## 2014-05-15 DIAGNOSIS — R7989 Other specified abnormal findings of blood chemistry: Secondary | ICD-10-CM

## 2014-05-15 DIAGNOSIS — Z947 Corneal transplant status: Secondary | ICD-10-CM

## 2014-05-15 DIAGNOSIS — E119 Type 2 diabetes mellitus without complications: Secondary | ICD-10-CM | POA: Diagnosis present

## 2014-05-15 DIAGNOSIS — K449 Diaphragmatic hernia without obstruction or gangrene: Secondary | ICD-10-CM | POA: Diagnosis present

## 2014-05-15 DIAGNOSIS — K8050 Calculus of bile duct without cholangitis or cholecystitis without obstruction: Secondary | ICD-10-CM | POA: Diagnosis not present

## 2014-05-15 DIAGNOSIS — Z8261 Family history of arthritis: Secondary | ICD-10-CM

## 2014-05-15 DIAGNOSIS — Z7982 Long term (current) use of aspirin: Secondary | ICD-10-CM

## 2014-05-15 DIAGNOSIS — R911 Solitary pulmonary nodule: Secondary | ICD-10-CM | POA: Diagnosis present

## 2014-05-15 DIAGNOSIS — Z800 Family history of malignant neoplasm of digestive organs: Secondary | ICD-10-CM

## 2014-05-15 DIAGNOSIS — R1013 Epigastric pain: Secondary | ICD-10-CM | POA: Diagnosis not present

## 2014-05-15 DIAGNOSIS — Z8249 Family history of ischemic heart disease and other diseases of the circulatory system: Secondary | ICD-10-CM

## 2014-05-15 DIAGNOSIS — M810 Age-related osteoporosis without current pathological fracture: Secondary | ICD-10-CM | POA: Diagnosis present

## 2014-05-15 DIAGNOSIS — E46 Unspecified protein-calorie malnutrition: Secondary | ICD-10-CM | POA: Diagnosis present

## 2014-05-15 DIAGNOSIS — Z833 Family history of diabetes mellitus: Secondary | ICD-10-CM

## 2014-05-15 DIAGNOSIS — E785 Hyperlipidemia, unspecified: Secondary | ICD-10-CM | POA: Diagnosis present

## 2014-05-15 DIAGNOSIS — R945 Abnormal results of liver function studies: Secondary | ICD-10-CM

## 2014-05-15 DIAGNOSIS — R740 Nonspecific elevation of levels of transaminase and lactic acid dehydrogenase [LDH]: Secondary | ICD-10-CM

## 2014-05-15 DIAGNOSIS — Z66 Do not resuscitate: Secondary | ICD-10-CM | POA: Diagnosis present

## 2014-05-15 DIAGNOSIS — N182 Chronic kidney disease, stage 2 (mild): Secondary | ICD-10-CM | POA: Diagnosis present

## 2014-05-15 DIAGNOSIS — K859 Acute pancreatitis, unspecified: Secondary | ICD-10-CM

## 2014-05-15 DIAGNOSIS — Z87311 Personal history of (healed) other pathological fracture: Secondary | ICD-10-CM

## 2014-05-15 HISTORY — DX: Essential (primary) hypertension: I10

## 2014-05-15 HISTORY — DX: Type 2 diabetes mellitus without complications: E11.9

## 2014-05-15 HISTORY — DX: Diaphragmatic hernia without obstruction or gangrene: K44.9

## 2014-05-15 LAB — COMPREHENSIVE METABOLIC PANEL
ALBUMIN: 3.3 g/dL — AB (ref 3.5–5.2)
ALT: 83 U/L — AB (ref 0–35)
AST: 140 U/L — AB (ref 0–37)
Alkaline Phosphatase: 121 U/L — ABNORMAL HIGH (ref 39–117)
Anion gap: 11 (ref 5–15)
BILIRUBIN TOTAL: 0.3 mg/dL (ref 0.3–1.2)
BUN: 22 mg/dL (ref 6–23)
CHLORIDE: 103 meq/L (ref 96–112)
CO2: 26 meq/L (ref 19–32)
Calcium: 9.1 mg/dL (ref 8.4–10.5)
Creatinine, Ser: 0.81 mg/dL (ref 0.50–1.10)
GFR calc Af Amer: 74 mL/min — ABNORMAL LOW (ref >90)
GFR, EST NON AFRICAN AMERICAN: 64 mL/min — AB (ref >90)
Glucose, Bld: 210 mg/dL — ABNORMAL HIGH (ref 70–99)
POTASSIUM: 3.8 meq/L (ref 3.7–5.3)
SODIUM: 140 meq/L (ref 137–147)
Total Protein: 6.3 g/dL (ref 6.0–8.3)

## 2014-05-15 LAB — CBC
HCT: 36.1 % (ref 36.0–46.0)
Hemoglobin: 12.1 g/dL (ref 12.0–15.0)
MCH: 28.6 pg (ref 26.0–34.0)
MCHC: 33.5 g/dL (ref 30.0–36.0)
MCV: 85.3 fL (ref 78.0–100.0)
PLATELETS: 158 10*3/uL (ref 150–400)
RBC: 4.23 MIL/uL (ref 3.87–5.11)
RDW: 13.4 % (ref 11.5–15.5)
WBC: 7.1 10*3/uL (ref 4.0–10.5)

## 2014-05-15 LAB — I-STAT TROPONIN, ED: TROPONIN I, POC: 0 ng/mL (ref 0.00–0.08)

## 2014-05-15 LAB — LIPASE, BLOOD: LIPASE: 67 U/L — AB (ref 11–59)

## 2014-05-15 MED ORDER — LISINOPRIL 20 MG PO TABS
20.0000 mg | ORAL_TABLET | Freq: Every day | ORAL | Status: DC
  Administered 2014-05-16 – 2014-05-19 (×4): 20 mg via ORAL
  Filled 2014-05-15 (×4): qty 1

## 2014-05-15 MED ORDER — CALCIUM CARBONATE-VITAMIN D 500-200 MG-UNIT PO TABS
1.0000 | ORAL_TABLET | Freq: Two times a day (BID) | ORAL | Status: DC
  Administered 2014-05-17 – 2014-05-19 (×4): 1 via ORAL
  Filled 2014-05-15 (×9): qty 1

## 2014-05-15 MED ORDER — LISINOPRIL-HYDROCHLOROTHIAZIDE 20-12.5 MG PO TABS: 1.0000 | ORAL_TABLET | Freq: Every day | ORAL | Status: DC

## 2014-05-15 MED ORDER — CYCLOSPORINE 0.05 % OP EMUL
1.0000 [drp] | Freq: Two times a day (BID) | OPHTHALMIC | Status: DC
  Administered 2014-05-16 – 2014-05-19 (×6): 1 [drp] via OPHTHALMIC
  Filled 2014-05-15 (×10): qty 1

## 2014-05-15 MED ORDER — SODIUM CHLORIDE 0.9 % IV SOLN
INTRAVENOUS | Status: AC
Start: 2014-05-15 — End: 2014-05-16
  Administered 2014-05-15: via INTRAVENOUS

## 2014-05-15 MED ORDER — PANTOPRAZOLE SODIUM 40 MG PO TBEC
40.0000 mg | DELAYED_RELEASE_TABLET | Freq: Every day | ORAL | Status: DC
Start: 2014-05-16 — End: 2014-05-19
  Administered 2014-05-16 – 2014-05-19 (×4): 40 mg via ORAL
  Filled 2014-05-15 (×3): qty 1

## 2014-05-15 MED ORDER — INSULIN ASPART 100 UNIT/ML ~~LOC~~ SOLN
0.0000 [IU] | Freq: Three times a day (TID) | SUBCUTANEOUS | Status: DC
  Administered 2014-05-18 (×2): 1 [IU] via SUBCUTANEOUS
  Administered 2014-05-19: 2 [IU] via SUBCUTANEOUS

## 2014-05-15 MED ORDER — POLYVINYL ALCOHOL 1.4 % OP SOLN: 1.0000 [drp] | Freq: Three times a day (TID) | OPHTHALMIC | Status: DC | PRN

## 2014-05-15 MED ORDER — ACETAMINOPHEN 325 MG PO TABS: 650.0000 mg | ORAL_TABLET | Freq: Four times a day (QID) | ORAL | Status: DC | PRN

## 2014-05-15 MED ORDER — ASPIRIN 81 MG PO CHEW
81.0000 mg | CHEWABLE_TABLET | Freq: Every day | ORAL | Status: DC
  Administered 2014-05-16 – 2014-05-19 (×4): 81 mg via ORAL
  Filled 2014-05-15 (×5): qty 1

## 2014-05-15 MED ORDER — ATORVASTATIN CALCIUM 10 MG PO TABS
10.0000 mg | ORAL_TABLET | Freq: Every day | ORAL | Status: DC
  Filled 2014-05-15: qty 1

## 2014-05-15 MED ORDER — HYDROCHLOROTHIAZIDE 12.5 MG PO CAPS
12.5000 mg | ORAL_CAPSULE | Freq: Every day | ORAL | Status: DC
  Administered 2014-05-16 – 2014-05-19 (×4): 12.5 mg via ORAL
  Filled 2014-05-15 (×4): qty 1

## 2014-05-15 MED ORDER — HEPARIN SODIUM (PORCINE) 5000 UNIT/ML IJ SOLN
5000.0000 [IU] | Freq: Three times a day (TID) | INTRAMUSCULAR | Status: DC
  Administered 2014-05-15 – 2014-05-16 (×4): 5000 [IU] via SUBCUTANEOUS
  Filled 2014-05-15 (×8): qty 1

## 2014-05-15 MED ORDER — INSULIN ASPART 100 UNIT/ML ~~LOC~~ SOLN: 0.0000 [IU] | Freq: Every day | SUBCUTANEOUS | Status: DC

## 2014-05-15 MED ORDER — ACETAMINOPHEN 650 MG RE SUPP
650.0000 mg | Freq: Four times a day (QID) | RECTAL | Status: DC | PRN
Start: 2014-05-15 — End: 2014-05-19

## 2014-05-15 NOTE — ED Notes (Signed)
TO AND FROM BR GAIT STEADY

## 2014-05-15 NOTE — ED Notes (Signed)
TO US

## 2014-05-15 NOTE — ED Notes (Signed)
Returned from u/s

## 2014-05-15 NOTE — ED Notes (Signed)
Sudden onset chest pain into back while eating fig bar and coffee had aspirin 324 mg po pta pain now between scapulla only bno sob no n/v no diaphoresis

## 2014-05-15 NOTE — H&P (Signed)
Date: 05/15/2014               Patient Name:  [name] [name] MRN: [id number]  DOB: [birth year] Age / Sex: 80 y.o., female   PCP: Tamsen Roers, MD         Medical Service: Internal Medicine Teaching Service         Attending Physician: Dr. Madilyn Fireman, MD    First Contact: Dr. Karle Starch Moding Pager: [phone]  Second Contact: Dr. Karlyn Agee Pager: [phone]       After Hours (After 5p/  First Contact Pager: [phone]  weekends / holidays): Second Contact Pager: [phone]   Chief Complaint: Epigastric pain  History of Present Illness:   [name] [name] is a 80 year old female with a history of type 2 diabetes, hypertension, hyperlipidemia, GERD, hiatal hernia, cholecystectomy who presents with epigastric pain. Patient states that around 3 PM today, she was having Fig [employer] and coffee when she started having intense epigastric pain. She states that it was a 200 out of 10 in severity, a sensation of tightness, and radiated to her back. She says the pain then resolved but then recurred prompted her to be brought into the hospital. She had associated nausea without any vomiting. She denies any recent fevers, chills, chest pain, shortness of breath, diarrhea, constipation. Since her initial evaluation in the field, patient's pain has resolved again. Patient currently is asymptomatic in the emergency department.   Meds: No current facility-administered medications for this encounter.   Current Outpatient Prescriptions  Medication Sig Dispense Refill  . aspirin 81 MG tablet Take 81 mg by mouth daily.      Marland Kitchen atorvastatin (LIPITOR) 10 MG tablet Take 10 mg by mouth daily.      . calcium citrate-vitamin D (CITRACAL+D) 315-200 MG-UNIT per tablet Take 1 tablet by mouth 2 (two) times daily.      . cycloSPORINE (RESTASIS) 0.05 % ophthalmic emulsion Place 1 drop into both eyes 2 (two) times daily.      . ergocalciferol (VITAMIN D2) 50000 UNITS capsule Take 50,000 Units by mouth once a week. On saturdays       . lisinopril-hydrochlorothiazide (PRINZIDE,ZESTORETIC) 20-12.5 MG per tablet Take 1 tablet by mouth daily.      . metFORMIN (GLUCOPHAGE) 500 MG tablet Take 500 mg by mouth daily with breakfast.      . metoprolol tartrate (LOPRESSOR) 25 MG tablet Take 25 mg by mouth daily.       Marland Kitchen omega-3 acid ethyl esters (LOVAZA) 1 G capsule Take 1 g by mouth daily.      Marland Kitchen omeprazole (PRILOSEC OTC) 20 MG tablet Take 20 mg by mouth daily.      Vladimir Faster Glycol-Propyl Glycol (SYSTANE) 0.4-0.3 % SOLN Place 1 drop into both eyes 3 (three) times daily as needed (dry eyes).        Allergies: Allergies as of 05/15/2014  . (No Known Allergies)   Past Medical History  Diagnosis Date  . Hypertension   . Diabetes mellitus without complication 1275  . Hiatal hernia    Past Surgical History  Procedure Laterality Date  . Cholecystectomy  2008  . Cornea trasplant  2005    at [place] tunnel Bilateral ~2010  . Abdominal hysterectomy  1970s   Family History  Problem Relation Age of Onset  . Hypertension Mother   . Osteoarthritis Mother   . Colon cancer Sister   . Diabetes Daughter   . Rheum arthritis Son  History   Social History  . Marital Status: Widowed    Spouse Name: N/A    Number of Children: N/A  . Years of Education: N/A   Occupational History  . Not on file.   Social History Main Topics  . Smoking status: Former Smoker -- 0.50 packs/day for 1 years    Types: Cigarettes    Quit date: 05/15/1953  . Smokeless tobacco: Current User    Types: Chew  . Alcohol Use: No  . Drug Use: No  . Sexual Activity: Not on file   Other Topics Concern  . Not on file   Social History Narrative  . No narrative on file    Review of Systems: Pertinent items are noted in HPI.  Physical Exam: Blood pressure 129/42, pulse 61, temperature 98.5 F (36.9 C), resp. rate 18, height 5\' 2"  (1.575 m), weight 64.411 kg (142 lb), SpO2 96.00%. General: resting in bed HEENT: PERRL, EOMI, no  scleral icterus, OS STATUS post corneal transplant, OD with peripheral area of the iris with different coloration than the rest of the iris Cardiac: RRR, no rubs, murmurs or gallops Pulm: clear to auscultation bilaterally, moving normal volumes of air Abd: soft, nontender, nondistended, BS present Ext: warm and well perfused, no pedal edema Neuro: alert and oriented X3, cranial nerves II-XII grossly intact  Lab results: Basic Metabolic Panel:  Recent Labs  05/15/14 1815  NA 140  K 3.8  CL 103  CO2 26  GLUCOSE 210*  BUN 22  CREATININE 0.81  CALCIUM 9.1   Liver Function Tests:  Recent Labs  05/15/14 1815  AST 140*  ALT 83*  ALKPHOS 121*  BILITOT 0.3  PROT 6.3  ALBUMIN 3.3*    Recent Labs  05/15/14 1815  LIPASE 67*   No results found for this basename: AMMONIA,  in the last 72 hours CBC:  Recent Labs  05/15/14 1815  WBC 7.1  HGB 12.1  HCT 36.1  MCV 85.3  PLT 158   Cardiac Enzymes: No results found for this basename: CKTOTAL, CKMB, CKMBINDEX, TROPONINI,  in the last 72 hours BNP: No results found for this basename: PROBNP,  in the last 72 hours D-Dimer: No results found for this basename: DDIMER,  in the last 72 hours CBG: No results found for this basename: GLUCAP,  in the last 72 hours Hemoglobin A1C: No results found for this basename: HGBA1C,  in the last 72 hours Fasting Lipid Panel: No results found for this basename: CHOL, HDL, LDLCALC, TRIG, CHOLHDL, LDLDIRECT,  in the last 72 hours Thyroid Function Tests: No results found for this basename: TSH, T4TOTAL, FREET4, T3FREE, THYROIDAB,  in the last 72 hours Anemia Panel: No results found for this basename: VITAMINB12, FOLATE, FERRITIN, TIBC, IRON, RETICCTPCT,  in the last 72 hours Coagulation: No results found for this basename: LABPROT, INR,  in the last 72 hours Urine Drug Screen: Drugs of Abuse  No results found for this basename: labopia,  cocainscrnur,  labbenz,  amphetmu,  thcu,  labbarb      Alcohol Level: No results found for this basename: ETH,  in the last 72 hours Urinalysis: No results found for this basename: COLORURINE, APPERANCEUR, LABSPEC, PHURINE, GLUCOSEU, HGBUR, BILIRUBINUR, KETONESUR, PROTEINUR, UROBILINOGEN, NITRITE, LEUKOCYTESUR,  in the last 72 hours  Imaging results:  US Abdomen Complete  05/15/2014   CLINICAL DATA:  Abnormal LFTs.  EXAM: ULTRASOUND ABDOMEN COMPLETE  COMPARISON:  ERCP 05/04/2007.  FINDINGS: Gallbladder:  Cholecystectomy.  Common bile duct:  Diameter: 4 mm.  Liver:  No focal lesion identified. Within normal limits in parenchymal echogenicity.  IVC:  No abnormality visualized.  Pancreas:  Visualized portion unremarkable.  Spleen:  Size and appearance within normal limits.  Right Kidney:  Length: 10.1 cm. Echogenicity within normal limits. No mass or hydronephrosis visualized.  Left Kidney:  Length: 9.4 cm. Echogenicity within normal limits. No significant mass or hydronephrosis visualized. 3.0 cm simple left renal cell cyst.  Abdominal aorta:  No aneurysm visualized.  Other findings:  None.  IMPRESSION: 1. Cholecystectomy. No biliary distention. No focal hepatic abnormality. Hepatic echotexture normal. 2. Simple left renal cyst.   Electronically Signed   By: [place]   On: 05/15/2014 20:27   Dg Chest Port 1 View  05/15/2014   CLINICAL DATA:  CHEST PAIN  EXAM: PORTABLE CHEST - 1 VIEW  COMPARISON:  02/20/2008  FINDINGS: Mild prominence of bibasilar interstitial markings. No confluent airspace infiltrate or overt interstitial edema. Heart size remains normal. Atheromatous aorta. No effusion. Visualized skeletal structures are unremarkable.  IMPRESSION: 1. Progressive prominence of bibasilar interstitial markings since 2009, without convincing acute or superimposed abnormality.   Electronically Signed   By: Arne Cleveland M.D.   On: 05/15/2014 18:29    Other results: EKG: normal EKG, normal sinus rhythm.  Assessment & Plan by Problem: Principal  Problem:   Epigastric pain Active Problems:   DM type 2 (diabetes mellitus, type 2)   HYPERTENSION   GERD   Transaminitis   CKD (chronic kidney disease) stage 2, GFR 60-89 ml/min  Patient is a 80 year old female with a history of type 2 diabetes, hypertension, hyperlipidemia, GERD, hiatal hernia, cholecystectomy who presents with epigastric pain with associated elevated transaminases and alkaline phosphatase.  Epigastric pain:  Patient currently not having any pain. Cardiac etiology less likely given normal EKG and troponin negative x1. Patient is status post cholecystectomy. Elevated transaminases and alkaline phosphatase.   No biliary distention or hepatic abnormalities seen on ultrasound. NAFLD not likely given normal hepatic echotexture on ultrasound. Less likely to be pancreatitis given lack of symptoms. Mildly elevated lipase, though patient on thiazide diuretic. Liver enzymes last checked in 2008 without any elevation. Given the long interval since last test, chronic hepatitis is possible. Otherwise, may consider less likely etiologies if negative hepatitis panel, including Wilson's given peripheral iris coloration.  - GI to evaluate  - NPO until GI evaluation  - 1/2 NS 75 ml/hr  - repeat CMP in morning  - Hepatitis panel in the morning  Type 2 diabetes mellitus  - SSI  - hold home metformin   Hypertension: Further clarification needed as patient is on metoprolol tartrate daily  - currently NPO, will monitor BP  - can continue home PO meds pending GI eval  Hyperlipidemia  - can continue home PO meds pending GI eval  GERD/hiatal hernia: Patient states that symptoms from earlier today were much more severe than her chronic GERD symptoms.  - can continue home PO meds pending GI eval  Dispo: Disposition is deferred at this time, awaiting improvement of current medical problems. Anticipated discharge in approximately 1 day(s).   The patient does have a current PCP Tamsen Roers,  MD) and does not need an [facility] hospital follow-up appointment after discharge.  The patient does not have transportation limitations that hinder transportation to clinic appointments.  Signed: Luan Moore, MD 05/15/2014, 11:05 PM

## 2014-05-16 ENCOUNTER — Encounter: Payer: Self-pay | Admitting: Gastroenterology

## 2014-05-16 ENCOUNTER — Inpatient Hospital Stay (HOSPITAL_COMMUNITY): Payer: Medicare Other

## 2014-05-16 DIAGNOSIS — M810 Age-related osteoporosis without current pathological fracture: Secondary | ICD-10-CM | POA: Diagnosis present

## 2014-05-16 DIAGNOSIS — Z8249 Family history of ischemic heart disease and other diseases of the circulatory system: Secondary | ICD-10-CM | POA: Diagnosis not present

## 2014-05-16 DIAGNOSIS — I10 Essential (primary) hypertension: Secondary | ICD-10-CM

## 2014-05-16 DIAGNOSIS — E785 Hyperlipidemia, unspecified: Secondary | ICD-10-CM | POA: Diagnosis present

## 2014-05-16 DIAGNOSIS — R1013 Epigastric pain: Secondary | ICD-10-CM | POA: Diagnosis present

## 2014-05-16 DIAGNOSIS — Z800 Family history of malignant neoplasm of digestive organs: Secondary | ICD-10-CM | POA: Diagnosis not present

## 2014-05-16 DIAGNOSIS — Z833 Family history of diabetes mellitus: Secondary | ICD-10-CM | POA: Diagnosis not present

## 2014-05-16 DIAGNOSIS — Z8261 Family history of arthritis: Secondary | ICD-10-CM | POA: Diagnosis not present

## 2014-05-16 DIAGNOSIS — E119 Type 2 diabetes mellitus without complications: Secondary | ICD-10-CM | POA: Diagnosis present

## 2014-05-16 DIAGNOSIS — R911 Solitary pulmonary nodule: Secondary | ICD-10-CM | POA: Diagnosis present

## 2014-05-16 DIAGNOSIS — Z87311 Personal history of (healed) other pathological fracture: Secondary | ICD-10-CM | POA: Diagnosis not present

## 2014-05-16 DIAGNOSIS — R7989 Other specified abnormal findings of blood chemistry: Secondary | ICD-10-CM

## 2014-05-16 DIAGNOSIS — K449 Diaphragmatic hernia without obstruction or gangrene: Secondary | ICD-10-CM

## 2014-05-16 DIAGNOSIS — R7402 Elevation of levels of lactic acid dehydrogenase (LDH): Secondary | ICD-10-CM | POA: Diagnosis not present

## 2014-05-16 DIAGNOSIS — Z66 Do not resuscitate: Secondary | ICD-10-CM | POA: Diagnosis present

## 2014-05-16 DIAGNOSIS — K219 Gastro-esophageal reflux disease without esophagitis: Secondary | ICD-10-CM | POA: Diagnosis present

## 2014-05-16 DIAGNOSIS — F17200 Nicotine dependence, unspecified, uncomplicated: Secondary | ICD-10-CM | POA: Diagnosis present

## 2014-05-16 DIAGNOSIS — Z79899 Other long term (current) drug therapy: Secondary | ICD-10-CM | POA: Diagnosis not present

## 2014-05-16 DIAGNOSIS — Z7982 Long term (current) use of aspirin: Secondary | ICD-10-CM | POA: Diagnosis not present

## 2014-05-16 DIAGNOSIS — E46 Unspecified protein-calorie malnutrition: Secondary | ICD-10-CM | POA: Diagnosis present

## 2014-05-16 DIAGNOSIS — K8050 Calculus of bile duct without cholangitis or cholecystitis without obstruction: Secondary | ICD-10-CM | POA: Diagnosis present

## 2014-05-16 DIAGNOSIS — I129 Hypertensive chronic kidney disease with stage 1 through stage 4 chronic kidney disease, or unspecified chronic kidney disease: Secondary | ICD-10-CM | POA: Diagnosis present

## 2014-05-16 DIAGNOSIS — K859 Acute pancreatitis without necrosis or infection, unspecified: Secondary | ICD-10-CM

## 2014-05-16 DIAGNOSIS — Z947 Corneal transplant status: Secondary | ICD-10-CM | POA: Diagnosis not present

## 2014-05-16 DIAGNOSIS — N182 Chronic kidney disease, stage 2 (mild): Secondary | ICD-10-CM | POA: Diagnosis present

## 2014-05-16 LAB — GLUCOSE, CAPILLARY
GLUCOSE-CAPILLARY: 90 mg/dL (ref 70–99)
GLUCOSE-CAPILLARY: 89 mg/dL (ref 70–99)
Glucose-Capillary: 85 mg/dL (ref 70–99)
Glucose-Capillary: 89 mg/dL (ref 70–99)

## 2014-05-16 LAB — COMPREHENSIVE METABOLIC PANEL
ALK PHOS: 105 U/L (ref 39–117)
ALT: 78 U/L — AB (ref 0–35)
AST: 73 U/L — ABNORMAL HIGH (ref 0–37)
Albumin: 3 g/dL — ABNORMAL LOW (ref 3.5–5.2)
Anion gap: 11 (ref 5–15)
BUN: 15 mg/dL (ref 6–23)
CO2: 27 meq/L (ref 19–32)
Calcium: 8.7 mg/dL (ref 8.4–10.5)
Chloride: 107 mEq/L (ref 96–112)
Creatinine, Ser: 0.66 mg/dL (ref 0.50–1.10)
GFR calc Af Amer: >90 mL/min (ref >90)
GFR, EST NON AFRICAN AMERICAN: 78 mL/min — AB (ref >90)
Glucose, Bld: 92 mg/dL (ref 70–99)
POTASSIUM: 4 meq/L (ref 3.7–5.3)
SODIUM: 145 meq/L (ref 137–147)
Total Bilirubin: 0.4 mg/dL (ref 0.3–1.2)
Total Protein: 5.7 g/dL — ABNORMAL LOW (ref 6.0–8.3)

## 2014-05-16 LAB — HEPATITIS PANEL, ACUTE
HCV Ab: NEGATIVE
HEP B C IGM: NONREACTIVE
Hep A IgM: NONREACTIVE
Hepatitis B Surface Ag: NEGATIVE

## 2014-05-16 MED ORDER — GADOBENATE DIMEGLUMINE 529 MG/ML IV SOLN
14.0000 mL | Freq: Once | INTRAVENOUS | Status: AC | PRN
  Administered 2014-05-16: 14 mL via INTRAVENOUS

## 2014-05-16 MED ORDER — CIPROFLOXACIN IN D5W 400 MG/200ML IV SOLN
400.0000 mg | Freq: Two times a day (BID) | INTRAVENOUS | Status: DC
  Administered 2014-05-16 – 2014-05-17 (×2): 400 mg via INTRAVENOUS
  Filled 2014-05-16 (×3): qty 200

## 2014-05-16 NOTE — Consult Note (Signed)
[facility] Consult: 11:27 AM 05/16/2014  LOS: 1 day    Referring Provider: Dr  Ellwood Dense Primary Care Physician:  Tamsen Roers, MD Primary Gastroenterologist:  Dr. Earle Gell (colonoscopie 2004, 2009)   Dr Ardis Hughs (ERCP 2008)    Reason for Consultation:  Upper abdominal pain.    HPI: [name] [name] is a 80 y.o. female.  Hx diabetes, htn, svt, osteoporosis with hx compression fractures.  S/p open cholecystectomy in 2008 and subsequent ERCP/ sphinct/stoneremoval and stent placement 2008.  Do not see documentation of stent removal  Totally asymptomatic in terms of GI issues.  At 3 Pm yesterday afte snack of fig bar, developed epigastric pain radiating across thoracic spine bilaterally.  Lasted one hour, she took a tums during that time.  Pain recurred again later in afternoon and nausea but no emesis and went to local fire dept.  Brought to ED. LFTs abnormal, though AST > ALT she does not consume ETOH.  Lipase 67. On ultrasound has 4 mm CBD, no biliary stent present. No fluid collection or stones/sludge in CBD Pain and nausea has not recurred since yesterday afternoon.   Past Medical History  Diagnosis Date  . Hypertension   . Diabetes mellitus without complication 9379  . Hiatal hernia     Past Surgical History  Procedure Laterality Date  . Cholecystectomy  2008  . Cornea trasplant  2005    at [place] tunnel Bilateral ~2010  . Abdominal hysterectomy  1970s    Prior to Admission medications   Medication Sig Start Date End Date Taking? Authorizing Provider  aspirin 81 MG tablet Take 81 mg by mouth daily.   Yes Historical Provider, MD  atorvastatin (LIPITOR) 10 MG tablet Take 10 mg by mouth daily.   Yes Historical Provider, MD  calcium citrate-vitamin D (CITRACAL+D) 315-200 MG-UNIT per  tablet Take 1 tablet by mouth 2 (two) times daily.   Yes Historical Provider, MD  cycloSPORINE (RESTASIS) 0.05 % ophthalmic emulsion Place 1 drop into both eyes 2 (two) times daily.   Yes Historical Provider, MD  ergocalciferol (VITAMIN D2) 50000 UNITS capsule Take 50,000 Units by mouth once a week. On saturdays   Yes Historical Provider, MD  lisinopril-hydrochlorothiazide (PRINZIDE,ZESTORETIC) 20-12.5 MG per tablet Take 1 tablet by mouth daily.   Yes Historical Provider, MD  metFORMIN (GLUCOPHAGE) 500 MG tablet Take 500 mg by mouth daily with breakfast.   Yes Historical Provider, MD  metoprolol tartrate (LOPRESSOR) 25 MG tablet Take 25 mg by mouth daily.    Yes Historical Provider, MD  omega-3 acid ethyl esters (LOVAZA) 1 G capsule Take 1 g by mouth daily.   Yes Historical Provider, MD  omeprazole (PRILOSEC OTC) 20 MG tablet Take 20 mg by mouth daily.   Yes Historical Provider, MD  Polyethyl Glycol-Propyl Glycol (SYSTANE) 0.4-0.3 % SOLN Place 1 drop into both eyes 3 (three) times daily as needed (dry eyes).   Yes Historical Provider, MD    Scheduled Meds: . aspirin  81 mg Oral Daily  . calcium-vitamin D  1 tablet Oral  BID WC  . cycloSPORINE  1 drop Both Eyes BID  . heparin  5,000 Units Subcutaneous 3 times per day  . lisinopril  20 mg Oral Daily   And  . hydrochlorothiazide  12.5 mg Oral Daily  . insulin aspart  0-5 Units Subcutaneous QHS  . insulin aspart  0-9 Units Subcutaneous TID WC  . pantoprazole  40 mg Oral Daily   Infusions: . sodium chloride 75 mL/hr at 05/15/14 2338   PRN Meds: acetaminophen, acetaminophen, polyvinyl alcohol   Allergies as of 05/15/2014  . (No Known Allergies)    Family History  Problem Relation Age of Onset  . Hypertension Mother   . Osteoarthritis Mother   . Colon cancer Sister   . Diabetes Daughter   . Rheum arthritis Son     History   Social History  . Marital Status: Widowed    Spouse Name: N/A    Number of Children: N/A  . Years of  Education: N/A   Occupational History  . Not on file.   Social History Main Topics  . Smoking status: Former Smoker -- 0.50 packs/day for 1 years    Types: Cigarettes    Quit date: 05/15/1953  . Smokeless tobacco: Current User    Types: Chew  . Alcohol Use: No  . Drug Use: No  . Sexual Activity: Not on file   Other Topics Concern  . Not on file   Social History Narrative  . No narrative on file    REVIEW OF SYSTEMS: Constitutional:  Weight stable, no fatigue ENT:  No nose bleeds Pulm:  No SOB or cough.  Limited excercise CV:  No palpitations, no LE edema.  GU:  No hematuria, no frequency, no tea colored urine GI:  Per HPI Heme:  No hx anemia   Transfusions:  None Neuro:  No headaches, no peripheral tingling or numbness MS:  Knee joints are historically unstable so wears brace on left.  Walks assisted by others or leans on furniture, reluctanct to use cane.  Derm:  No itching, no rash or sores.  Endocrine:  No sweats or chills.  No polyuria or dysuria Immunization:  Not queried.  Travel:  None beyond local counties in last few months.    PHYSICAL EXAM: Vital signs in last 24 hours: Filed Vitals:   05/16/14 0500  BP: 125/46  Pulse: 63  Temp: 97.6 F (36.4 C)  Resp: 18   Wt Readings from Last 3 Encounters:  05/15/14 64.955 kg (143 lb 3.2 oz)    General: looks well and comfortable Head:  No asymmetry or swelling  Eyes:  No icterus or pallor Ears:  Slightly HOH  Nose:  No discharge Mouth:  Clear and moist Neck:  No mass Lungs:  Clear.  Non-labored breathing Heart: RRR Abdomen:  No mass. Active BS.  Minor discomfort in epigastrium.  No HSM.   Rectal: deferred   Musc/Skeltl: no joint swelling or contracture Extremities:  No pedal edema  Neurologic:  Oriented x 3, no limb weakness or tremors Skin:  No rash, no telangectasia, no sores Tattoos:  none Nodes:  No cervical adenopathy.    Psych:  Pleasant, cooperative, appropriate, relaxed  Intake/Output from  previous day:   Intake/Output this shift:    LAB RESULTS:  Recent Labs  05/15/14 1815  WBC 7.1  HGB 12.1  HCT 36.1  PLT 158   BMET Lab Results  Component Value Date   NA 145 05/16/2014   NA 140 05/15/2014  NA 138 03/18/2007   K 4.0 05/16/2014   K 3.8 05/15/2014   K 3.9 03/18/2007   CL 107 05/16/2014   CL 103 05/15/2014   CL 107 03/18/2007   CO2 27 05/16/2014   CO2 26 05/15/2014   CO2 27 03/10/2007   GLUCOSE 92 05/16/2014   GLUCOSE 210* 05/15/2014   GLUCOSE 165* 03/18/2007   BUN 15 05/16/2014   BUN 22 05/15/2014   BUN 16 03/18/2007   CREATININE 0.66 05/16/2014   CREATININE 0.81 05/15/2014   CREATININE 0.7 03/18/2007   CALCIUM 8.7 05/16/2014   CALCIUM 9.1 05/15/2014   CALCIUM 8.7 03/10/2007   LFT  Recent Labs  05/15/14 1815 05/16/14 0654  PROT 6.3 5.7*  ALBUMIN 3.3* 3.0*  AST 140* 73*  ALT 83* 78*  ALKPHOS 121* 105  BILITOT 0.3 0.4   Lipase     Component Value Date/Time   LIPASE 67* 05/15/2014 1815    Drugs of Abuse  No results found for this basename: labopia, cocainscrnur, labbenz, amphetmu, thcu, labbarb     RADIOLOGY STUDIES: US Abdomen Complete  05/15/2014   CLINICAL DATA:  Abnormal LFTs.  EXAM: ULTRASOUND ABDOMEN COMPLETE  COMPARISON:  ERCP 05/04/2007.  FINDINGS: Gallbladder:  Cholecystectomy.  Common bile duct:  Diameter: 4 mm.  Liver:  No focal lesion identified. Within normal limits in parenchymal echogenicity.  IVC:  No abnormality visualized.  Pancreas:  Visualized portion unremarkable.  Spleen:  Size and appearance within normal limits.  Right Kidney:  Length: 10.1 cm. Echogenicity within normal limits. No mass or hydronephrosis visualized.  Left Kidney:  Length: 9.4 cm. Echogenicity within normal limits. No significant mass or hydronephrosis visualized. 3.0 cm simple left renal cell cyst.  Abdominal aorta:  No aneurysm visualized.  Other findings:  None.  IMPRESSION: 1. Cholecystectomy. No biliary distention. No focal hepatic abnormality. Hepatic echotexture normal. 2. Simple left  renal cyst.   Electronically Signed   By: [place]   On: 05/15/2014 20:27   Dg Chest Port 1 View  05/15/2014   CLINICAL DATA:  CHEST PAIN  EXAM: PORTABLE CHEST - 1 VIEW  COMPARISON:  02/20/2008  FINDINGS: Mild prominence of bibasilar interstitial markings. No confluent airspace infiltrate or overt interstitial edema. Heart size remains normal. Atheromatous aorta. No effusion. Visualized skeletal structures are unremarkable.  IMPRESSION: 1. Progressive prominence of bibasilar interstitial markings since 2009, without convincing acute or superimposed abnormality.   Electronically Signed   By: Arne Cleveland M.D.   On: 05/15/2014 18:29    ENDOSCOPIC STUDIES: 03/2007 ercp rendezvous procedure  DR Ardis Hughs T tube removal. Biliary sphincterotomy. Stones/sludge swept.  Long plastic biliary stent placed across major ampula    2004 colonoscopy   Dr Wynetta Emery Screening study Extensive left colon tics.  Small ascending colon polyp:  Adenomatous and hamartomatous polyps.   2009  Colonoscopy Dr Wynetta Emery Polyp removed from sigmoid per his office.  Path report not in epic but could be obtained by calling [phone]   IMPRESSION:   *  Abdominal pain.  Sounds like biliary colic.   *  S/p ERCP/sphint/stone removal/ biliary stent 2008.  Do not see subsequent endo to remove the stent.   *  S/p Lap chole and Ttube placement. 01/2007  *  Hx of adenomatous polyp 2004, polyp of undetermined type in 2009.  Dr Wynetta Emery advised pt she would not require any future colonoscopies.    PLAN:     *  MRCP *  CMET in AM   Judson Roch  [place]  05/16/2014, 11:27 AM Pager: [phone]  GI Attending Note   Chart was reviewed and patient was examined. X-rays and lab were reviewed.    I agree with management and plans.  Sandy Salaam. Deatra Ina, M.D., [facility] Cell [phone]

## 2014-05-16 NOTE — H&P (Signed)
INTERNAL MEDICINE TEACHING ATTENDING NOTE  Day 1 of stay  Patient name: [name] [name]  MRN: [id number] Date of birth: [birth year]   Key clinical points and exam                                                          80 y.o.female with DM2, hypertension, hyperlipidemia, GERD with hiatal hearnia (EGD done in 2007 by Dr Ardis Hughs), presented with two bouts of acute epigastric pain associated with some nausea, no vomiting, radiating to her back. The patient had an USG abdomen which was normal with no acute pathology. I have read Dr Trudee Kuster HPi and I agree with his documentation. I examined the patient. Her vitals are stable. She is currently pain free. She is alert, oriented, very pleasant to talk to. Her abdominal findings: Soft, non-tender, normal BS, non-distended. She has moist oral cavity, with dental bridging for the back teeth. Good oral hygiene. Normal tongue. She has a regular heart rate and rhythm, no pedal edema. I have reviewed the chart, lab results, EKG, imaging and relevant notes of this patient.   Assessment and Plan                                                                      Epigastric pain - Acute MI ruled out. Likely related to GERD and hiatal hearnia. Radiation to back - suspicion for PUD. Dr Deatra Ina was already contacted by the ED, so he will see her. We can decide on the next plan based on GI recommendations, however, the patient is stable and without pain right now and might be worked up as outpatient too. Agree with the hepatitis panel, and treatment for other chronic issues per Dr Trudee Kuster note.   I have seen and evaluated this patient and discussed it with my IM resident team - Dr Aundra Dubin, Dr Trudee Kuster and Dr Randell Patient.  Please see the rest of the plan per resident note from today.   [place], [place] 05/16/2014, 10:19 AM.

## 2014-05-16 NOTE — Progress Notes (Signed)
UR Completed.  [phone]  

## 2014-05-16 NOTE — Progress Notes (Signed)
MRCP demonstrates a small CBD stone. Plan ERCP tomorrow.

## 2014-05-16 NOTE — ED Provider Notes (Signed)
CSN: [id number]     Arrival date & time 05/15/14  1731 History   First MD Initiated Contact with Patient 05/15/14 1736     Chief Complaint  Patient presents with  . Chest Pain     HPI Sudden onset chest pain into back while eating fig bar and coffee had aspirin 324 mg po pta pain now between scapulla only bno sob no n/v no diaphoresis.  Patient does not drink alcohol.  Had cholecystectomy in 2008.  Did have complication after cholecystectomy.  Past Medical History  Diagnosis Date  . Hypertension   . Diabetes mellitus without complication 7628  . Hiatal hernia    Past Surgical History  Procedure Laterality Date  . Cholecystectomy  2008  . Cornea trasplant  2005    at [place] tunnel Bilateral ~2010  . Abdominal hysterectomy  1970s   Family History  Problem Relation Age of Onset  . Hypertension Mother   . Osteoarthritis Mother   . Colon cancer Sister   . Diabetes Daughter   . Rheum arthritis Son    History  Substance Use Topics  . Smoking status: Former Smoker -- 0.50 packs/day for 1 years    Types: Cigarettes    Quit date: 05/15/1953  . Smokeless tobacco: Current User    Types: Chew  . Alcohol Use: No   OB History   Grav Para Term Preterm Abortions TAB SAB Ect Mult Living                 Review of Systems  All other systems reviewed and are negative  Allergies  Review of patient's allergies indicates no known allergies.  Home Medications   Prior to Admission medications   Medication Sig Start Date End Date Taking? Authorizing Provider  aspirin 81 MG tablet Take 81 mg by mouth daily.   Yes Historical Provider, MD  atorvastatin (LIPITOR) 10 MG tablet Take 10 mg by mouth daily.   Yes Historical Provider, MD  calcium citrate-vitamin D (CITRACAL+D) 315-200 MG-UNIT per tablet Take 1 tablet by mouth 2 (two) times daily.   Yes Historical Provider, MD  cycloSPORINE (RESTASIS) 0.05 % ophthalmic emulsion Place 1 drop into both eyes 2 (two) times daily.   Yes  Historical Provider, MD  ergocalciferol (VITAMIN D2) 50000 UNITS capsule Take 50,000 Units by mouth once a week. On saturdays   Yes Historical Provider, MD  lisinopril-hydrochlorothiazide (PRINZIDE,ZESTORETIC) 20-12.5 MG per tablet Take 1 tablet by mouth daily.   Yes Historical Provider, MD  metFORMIN (GLUCOPHAGE) 500 MG tablet Take 500 mg by mouth daily with breakfast.   Yes Historical Provider, MD  metoprolol tartrate (LOPRESSOR) 25 MG tablet Take 25 mg by mouth daily.    Yes Historical Provider, MD  omega-3 acid ethyl esters (LOVAZA) 1 G capsule Take 1 g by mouth daily.   Yes Historical Provider, MD  omeprazole (PRILOSEC OTC) 20 MG tablet Take 20 mg by mouth daily.   Yes Historical Provider, MD  Polyethyl Glycol-Propyl Glycol (SYSTANE) 0.4-0.3 % SOLN Place 1 drop into both eyes 3 (three) times daily as needed (dry eyes).   Yes Historical Provider, MD   BP 151/53  Pulse 69  Temp(Src) 98.1 F (36.7 C) (Oral)  Resp 18  Ht 5\' 3"  (1.6 m)  Wt 143 lb 3.2 oz (64.955 kg)  BMI 25.37 kg/m2  SpO2 97% Physical Exam Physical Exam  Nursing note and vitals reviewed. Constitutional: She is oriented to person, place, and time. She appears well-developed and  well-nourished. No distress.  HENT:  Head: Normocephalic and atraumatic.  Eyes: Pupils are equal, round, and reactive to light.  Neck: Normal range of motion.  Cardiovascular: Normal rate and intact distal pulses.   Pulmonary/Chest: No respiratory distress.  Abdominal: Normal appearance. She exhibits no distension.  no abdominal rebound guarding tenderness noted Musculoskeletal: Normal range of motion.  Neurological: She is alert and oriented to person, place, and time. No cranial nerve deficit.  Skin: Skin is warm and dry. No rash noted.  Psychiatric: She has a normal mood and affect. Her behavior is normal.   ED Course  Procedures (including critical care time) Discussed with Dr. Deatra Ina from GI.  He recommended admission after ultrasound  done. Labs Review Labs Reviewed  COMPREHENSIVE METABOLIC PANEL - Abnormal; Notable for the following:    Glucose, Bld 210 (*)    Albumin 3.3 (*)    AST 140 (*)    ALT 83 (*)    Alkaline Phosphatase 121 (*)    GFR calc non Af Amer 64 (*)    GFR calc Af Amer 74 (*)    All other components within normal limits  LIPASE, BLOOD - Abnormal; Notable for the following:    Lipase 67 (*)    All other components within normal limits  CBC  HEPATITIS PANEL, ACUTE  COMPREHENSIVE METABOLIC PANEL  I-STAT TROPOININ, ED    Imaging Review US Abdomen Complete  05/15/2014   CLINICAL DATA:  Abnormal LFTs.  EXAM: ULTRASOUND ABDOMEN COMPLETE  COMPARISON:  ERCP 05/04/2007.  FINDINGS: Gallbladder:  Cholecystectomy.  Common bile duct:  Diameter: 4 mm.  Liver:  No focal lesion identified. Within normal limits in parenchymal echogenicity.  IVC:  No abnormality visualized.  Pancreas:  Visualized portion unremarkable.  Spleen:  Size and appearance within normal limits.  Right Kidney:  Length: 10.1 cm. Echogenicity within normal limits. No mass or hydronephrosis visualized.  Left Kidney:  Length: 9.4 cm. Echogenicity within normal limits. No significant mass or hydronephrosis visualized. 3.0 cm simple left renal cell cyst.  Abdominal aorta:  No aneurysm visualized.  Other findings:  None.  IMPRESSION: 1. Cholecystectomy. No biliary distention. No focal hepatic abnormality. Hepatic echotexture normal. 2. Simple left renal cyst.   Electronically Signed   By: [place]   On: 05/15/2014 20:27   Dg Chest Port 1 View  05/15/2014   CLINICAL DATA:  CHEST PAIN  EXAM: PORTABLE CHEST - 1 VIEW  COMPARISON:  02/20/2008  FINDINGS: Mild prominence of bibasilar interstitial markings. No confluent airspace infiltrate or overt interstitial edema. Heart size remains normal. Atheromatous aorta. No effusion. Visualized skeletal structures are unremarkable.  IMPRESSION: 1. Progressive prominence of bibasilar interstitial markings since  2009, without convincing acute or superimposed abnormality.   Electronically Signed   By: Arne Cleveland M.D.   On: 05/15/2014 18:29     EKG Interpretation   Date/Time:  Wednesday May 15 2014 17:32:24 EDT Ventricular Rate:  67 PR Interval:  136 QRS Duration: 89 QT Interval:  429 QTC Calculation: 453 R Axis:   27 Text Interpretation:  Sinus rhythm Otherwise no significant change  Confirmed by Simona Rocque  MD, Tucker Steedley ([id number]) on 05/15/2014 5:50:29 PM      MDM   Final diagnoses:  Acute pancreatitis, unspecified pancreatitis type  Abnormal LFTs        Dot Lanes, MD 05/16/14 0002

## 2014-05-16 NOTE — Progress Notes (Addendum)
Subjective:    Currently, the patient has no symptoms.  She says that the pain she felt was in her epigastric region and radiated to her back, but this has completely resolved.  She did not feel like there was good stuck in her throat.  She denies SOB, chest pain, abdominal pain, nausea currently.  Interval Events: -Vital signs stable. -LFTs improving this morning, AST 73, ALT 78, Alk Phos 105. -Seen by GI who is concerned about a bile duct stone and wants MRCP today.   Objective:    Vital Signs:   Temp:  [97.6 F (36.4 C)-98.5 F (36.9 C)] 97.6 F (36.4 C) (09/03 0500) Pulse Rate:  [55-69] 63 (09/03 0500) Resp:  [12-20] 18 (09/03 0500) BP: (117-151)/(39-62) 125/46 mmHg (09/03 0500) SpO2:  [95 %-98 %] 98 % (09/03 0500) Weight:  [142 lb (64.411 kg)-143 lb 3.2 oz (64.955 kg)] 143 lb 3.2 oz (64.955 kg) (09/02 2322)    24-hour weight change: Weight change:   Intake/Output:   Intake/Output Summary (Last 24 hours) at 05/16/14 1324 Last data filed at 05/16/14 0900  Gross per 24 hour  Intake      0 ml  Output      0 ml  Net      0 ml      Physical Exam: General: Vital signs reviewed and noted. Well-developed, well-nourished, in no acute distress; alert, appropriate and cooperative throughout examination.  Lungs:  Normal respiratory effort. Clear to auscultation BL without crackles or wheezes.  Heart: RRR. S1 and S2 normal without gallop, murmur, or rubs.  Abdomen:  Mild epigastric tenderness. BS normoactive. Soft, Nondistended. No masses or organomegaly.  Extremities: No pretibial edema.     Labs:  Basic Metabolic Panel:  Recent Labs Lab 05/15/14 1815 05/16/14 0654  NA 140 145  K 3.8 4.0  CL 103 107  CO2 26 27  GLUCOSE 210* 92  BUN 22 15  CREATININE 0.81 0.66  CALCIUM 9.1 8.7    Liver Function Tests:  Recent Labs Lab 05/15/14 1815 05/16/14 0654  AST 140* 73*  ALT 83* 78*  ALKPHOS 121* 105  BILITOT 0.3 0.4  PROT 6.3 5.7*  ALBUMIN 3.3* 3.0*     Recent Labs Lab 05/15/14 1815  LIPASE 67*   CBC:  Recent Labs Lab 05/15/14 1815  WBC 7.1  HGB 12.1  HCT 36.1  MCV 85.3  PLT 158   CBG:  Recent Labs Lab 05/15/14 2348 05/16/14 0655  GLUCAP 89 90   Acute hepatitis panel: Negative.  Other results: EKG: normal EKG, normal sinus rhythm, unchanged from previous tracings.  Imaging: US Abdomen Complete  05/15/2014   CLINICAL DATA:  Abnormal LFTs.  EXAM: ULTRASOUND ABDOMEN COMPLETE  COMPARISON:  ERCP 05/04/2007.  FINDINGS: Gallbladder:  Cholecystectomy.  Common bile duct:  Diameter: 4 mm.  Liver:  No focal lesion identified. Within normal limits in parenchymal echogenicity.  IVC:  No abnormality visualized.  Pancreas:  Visualized portion unremarkable.  Spleen:  Size and appearance within normal limits.  Right Kidney:  Length: 10.1 cm. Echogenicity within normal limits. No mass or hydronephrosis visualized.  Left Kidney:  Length: 9.4 cm. Echogenicity within normal limits. No significant mass or hydronephrosis visualized. 3.0 cm simple left renal cell cyst.  Abdominal aorta:  No aneurysm visualized.  Other findings:  None.  IMPRESSION: 1. Cholecystectomy. No biliary distention. No focal hepatic abnormality. Hepatic echotexture normal. 2. Simple left renal cyst.   Electronically Signed   By: Marcello Moores  Register  On: 05/15/2014 20:27   Dg Chest Port 1 View  05/15/2014   CLINICAL DATA:  CHEST PAIN  EXAM: PORTABLE CHEST - 1 VIEW  COMPARISON:  02/20/2008  FINDINGS: Mild prominence of bibasilar interstitial markings. No confluent airspace infiltrate or overt interstitial edema. Heart size remains normal. Atheromatous aorta. No effusion. Visualized skeletal structures are unremarkable.  IMPRESSION: 1. Progressive prominence of bibasilar interstitial markings since 2009, without convincing acute or superimposed abnormality.   Electronically Signed   By: Arne Cleveland M.D.   On: 05/15/2014 18:29       Medications:    Infusions: . sodium  chloride 75 mL/hr at 05/15/14 2338    Scheduled Medications: . aspirin  81 mg Oral Daily  . calcium-vitamin D  1 tablet Oral BID WC  . cycloSPORINE  1 drop Both Eyes BID  . heparin  5,000 Units Subcutaneous 3 times per day  . lisinopril  20 mg Oral Daily   And  . hydrochlorothiazide  12.5 mg Oral Daily  . insulin aspart  0-5 Units Subcutaneous QHS  . insulin aspart  0-9 Units Subcutaneous TID WC  . pantoprazole  40 mg Oral Daily    PRN Medications: acetaminophen, acetaminophen, polyvinyl alcohol   Assessment/ Plan:    Principal Problem:   Epigastric pain Active Problems:   DM type 2 (diabetes mellitus, type 2)   HYPERTENSION   GERD   Transaminitis   CKD (chronic kidney disease) stage 2, GFR 60-89 ml/min  #Epigastric pain Pain completely resolved.  Possibly due to gallstone in CBD due to description of pain, but must have been very transient.  Dr. Deatra Ina recommends MRCP will help to determine along with repeat CMP in the morning.  Called radiology who will try to perform today.  Very low suspicion for Wilson's, since LFTs already improving.  Acute hepatitis panel negative. -MRCP today or tomorrow. -NPO until then. -Continue NS 75 ml/hr while NPO. -Repeat CMP in morning.  #Type 2 diabetes mellitus  CBGs stable. -Continue SSI sensitive ACHS. -Hold home metformin   Hypertension Normotensive without home metoprolol.  Very low dose (25 mg tartrate daily) at home -Continue to hold metoprolol unless hypertensive.  Hyperlipidemia  On Atorvastatin at home, could be leading to elevated LFTs.  Will continue to hold until GI eval completed. -Hold home atorvastatin 10 mg daily.  #GERD/hiatal hernia Could have contributed to symptoms as well although pain not typical of GERD.  On PPI at home, no symptoms currently. -Continue PPI.   DVT PPX - heparin  CODE STATUS - DNR.  CONSULTS PLACED - GI.  DISPO - Disposition is deferred at this time, awaiting MRCP.   Anticipated  discharge in approximately 1 day(s).   The patient does have a current PCP Tamsen Roers, MD) and does not need an [facility] hospital follow-up appointment after discharge.    Is the [facility] hospital follow-up appointment a one-time only appointment? not applicable.  Does the patient have transportation limitations that hinder transportation to clinic appointments? no   SERVICE NEEDED AT [place]         Y = Yes, Blank = No PT:   OT:   RN:   Equipment:   Other:      Length of Stay: 1 day(s)   Signed: Arman Filter, MD  PGY-1, Internal Medicine Resident Pager: [phone] (7AM-5PM) 05/16/2014, 1:24 PM

## 2014-05-16 NOTE — Consult Note (Signed)
Patient was seen and examined.  X-rays were reviewed.  Full to follow.  Patient sudden onset of severe pain with radiation to the back, accompanied by mild abnormal LFTs, raises the question of a retained or recurrent bile duct stone.  This would be an unusual presentation for active peptic ulcer disease or reflux.  The patient has a history of retained bile duct stones and is status post ERCP with sphincterotomy.  A normal sized common bile duct by ultrasound mitigates against this diagnosis, however.  Recommend - MRCP

## 2014-05-17 ENCOUNTER — Encounter (HOSPITAL_COMMUNITY)
Admission: EM | Disposition: A | Discharge status: Home / Self Care | Payer: Medicare Other | Source: Home / Self Care | Attending: Internal Medicine

## 2014-05-17 ENCOUNTER — Inpatient Hospital Stay (HOSPITAL_COMMUNITY): Payer: Medicare Other

## 2014-05-17 ENCOUNTER — Encounter (HOSPITAL_COMMUNITY): Payer: Self-pay | Admitting: Anesthesiology

## 2014-05-17 ENCOUNTER — Inpatient Hospital Stay (HOSPITAL_COMMUNITY): Payer: Medicare Other | Admitting: Anesthesiology

## 2014-05-17 ENCOUNTER — Encounter (HOSPITAL_COMMUNITY): Payer: Medicare Other | Admitting: Anesthesiology

## 2014-05-17 DIAGNOSIS — J984 Other disorders of lung: Secondary | ICD-10-CM

## 2014-05-17 DIAGNOSIS — R911 Solitary pulmonary nodule: Secondary | ICD-10-CM | POA: Diagnosis present

## 2014-05-17 DIAGNOSIS — K8050 Calculus of bile duct without cholangitis or cholecystitis without obstruction: Principal | ICD-10-CM | POA: Diagnosis present

## 2014-05-17 HISTORY — PX: ERCP: SHX5425

## 2014-05-17 LAB — GLUCOSE, CAPILLARY
GLUCOSE-CAPILLARY: 101 mg/dL — AB (ref 70–99)
GLUCOSE-CAPILLARY: 76 mg/dL (ref 70–99)
GLUCOSE-CAPILLARY: 111 mg/dL — AB (ref 70–99)
GLUCOSE-CAPILLARY: 114 mg/dL — AB (ref 70–99)
GLUCOSE-CAPILLARY: 184 mg/dL — AB (ref 70–99)
Glucose-Capillary: 164 mg/dL — ABNORMAL HIGH (ref 70–99)

## 2014-05-17 LAB — COMPREHENSIVE METABOLIC PANEL
ALBUMIN: 2.9 g/dL — AB (ref 3.5–5.2)
ALT: 53 U/L — ABNORMAL HIGH (ref 0–35)
ANION GAP: 9 (ref 5–15)
AST: 38 U/L — ABNORMAL HIGH (ref 0–37)
Alkaline Phosphatase: 92 U/L (ref 39–117)
BUN: 11 mg/dL (ref 6–23)
CO2: 27 mEq/L (ref 19–32)
CREATININE: 0.8 mg/dL (ref 0.50–1.10)
Calcium: 8.4 mg/dL (ref 8.4–10.5)
Chloride: 108 mEq/L (ref 96–112)
GFR calc non Af Amer: 65 mL/min — ABNORMAL LOW (ref >90)
GFR, EST AFRICAN AMERICAN: 75 mL/min — AB (ref >90)
Glucose, Bld: 108 mg/dL — ABNORMAL HIGH (ref 70–99)
Potassium: 4.9 mEq/L (ref 3.7–5.3)
Sodium: 144 mEq/L (ref 137–147)
TOTAL PROTEIN: 5.6 g/dL — AB (ref 6.0–8.3)
Total Bilirubin: 0.3 mg/dL (ref 0.3–1.2)

## 2014-05-17 SURGERY — ERCP, WITH INTERVENTION IF INDICATED
Anesthesia: General

## 2014-05-17 MED ORDER — ONDANSETRON HCL 4 MG/2ML IJ SOLN
INTRAMUSCULAR | Status: DC | PRN
  Administered 2014-05-17: 4 mg via INTRAVENOUS

## 2014-05-17 MED ORDER — METOPROLOL TARTRATE 25 MG PO TABS
25.0000 mg | ORAL_TABLET | Freq: Every day | ORAL | Status: DC
  Filled 2014-05-17: qty 1

## 2014-05-17 MED ORDER — LIDOCAINE HCL (CARDIAC) 20 MG/ML IV SOLN
INTRAVENOUS | Status: DC | PRN
  Administered 2014-05-17: 80 mg via INTRAVENOUS

## 2014-05-17 MED ORDER — METOPROLOL TARTRATE 12.5 MG HALF TABLET
12.5000 mg | ORAL_TABLET | Freq: Two times a day (BID) | ORAL | Status: DC
  Administered 2014-05-17 – 2014-05-18 (×4): 12.5 mg via ORAL
  Filled 2014-05-17 (×6): qty 1

## 2014-05-17 MED ORDER — ARTIFICIAL TEARS OP OINT
TOPICAL_OINTMENT | OPHTHALMIC | Status: DC | PRN
  Administered 2014-05-17: 1 via OPHTHALMIC

## 2014-05-17 MED ORDER — SODIUM CHLORIDE 0.9 % IV SOLN: INTRAVENOUS | Status: DC

## 2014-05-17 MED ORDER — LACTATED RINGERS IV SOLN
INTRAVENOUS | Status: DC
Start: 2014-05-17 — End: 2014-05-18
  Administered 2014-05-17: 1000 mL via INTRAVENOUS

## 2014-05-17 MED ORDER — IOHEXOL 350 MG/ML SOLN
INTRAVENOUS | Status: DC | PRN
  Administered 2014-05-17: 14:00:00

## 2014-05-17 MED ORDER — LACTATED RINGERS IV SOLN
INTRAVENOUS | Status: DC | PRN
  Administered 2014-05-17: 13:00:00 via INTRAVENOUS

## 2014-05-17 MED ORDER — ATORVASTATIN CALCIUM 10 MG PO TABS
10.0000 mg | ORAL_TABLET | Freq: Every day | ORAL | Status: DC
  Administered 2014-05-17 – 2014-05-19 (×3): 10 mg via ORAL
  Filled 2014-05-17 (×3): qty 1

## 2014-05-17 MED ORDER — SUCCINYLCHOLINE CHLORIDE 20 MG/ML IJ SOLN
INTRAMUSCULAR | Status: DC | PRN
  Administered 2014-05-17: 50 mg via INTRAVENOUS

## 2014-05-17 MED ORDER — DEXTROSE 50 % IV SOLN
25.0000 mL | Freq: Once | INTRAVENOUS | Status: AC | PRN
  Administered 2014-05-17: 25 mL via INTRAVENOUS

## 2014-05-17 MED ORDER — FENTANYL CITRATE 0.05 MG/ML IJ SOLN
INTRAMUSCULAR | Status: DC | PRN
  Administered 2014-05-17: 100 ug via INTRAVENOUS

## 2014-05-17 MED ORDER — EPHEDRINE SULFATE 50 MG/ML IJ SOLN
INTRAMUSCULAR | Status: DC | PRN
  Administered 2014-05-17: 10 mg via INTRAVENOUS

## 2014-05-17 MED ORDER — PROPOFOL 10 MG/ML IV BOLUS
INTRAVENOUS | Status: DC | PRN
  Administered 2014-05-17: 100 mg via INTRAVENOUS

## 2014-05-17 NOTE — Op Note (Signed)
[facility] [place], [id number]   ERCP PROCEDURE REPORT  PATIENT: [name], [name].  MR# :[id number] BIRTHDATE: 1926-05-26  GENDER: Female ENDOSCOPIST: Inda Castle, MD REFERRED BY: PROCEDURE DATE:  05/17/2014 PROCEDURE:   ERCP with removal of calculus/calculi ASA CLASS:   Class III INDICATIONS:suspected or rule out bile duct stones. MEDICATIONS: See Anesthesia Report. TOPICAL ANESTHETIC:  DESCRIPTION OF PROCEDURE:   After the risks benefits and alternatives of the procedure were thoroughly explained, informed consent was obtained.  The     endoscope was introduced through the mouth  and advanced to the third portion of the duodenum .  A small periampullary diverticulum was seen.  There was a previous sphincterotomy. The common bile duct was cannulated with a 0.35 mm guidewire. Initial injection demonstrated a 5-6 mm filling defect.  There is mild, diffuse dilatation of the biliary system. A known stone extractor was inserted to the most proximal portion the common hepatic duct and inflated to 15 mm.  This was pulled down the bile duct and then lower to 12 mm.  A 6 mm bile duct stone was delivered.  Behind the stone was a small amount of pus. Balloon stone extraction was repeated 2 more times and.  No further stones were identified.  Final cholangiogram was normal. The scope was then completely withdrawn from the patient and the procedure terminated.     COMPLICATIONS:  ENDOSCOPIC IMPRESSION: 1.  choledocholithiasis-status post stone extraction 2.  periampullary diverticulum  RECOMMENDATIONS: advance diet    _______________________________ eSignedInda Castle, MD 05/17/2014 1:43 PM   HF:WYOVZ Rex Kras, MD , Oretha Caprice, M.D.

## 2014-05-17 NOTE — H&P (View-Only) (Signed)
MRCP demonstrates a small CBD stone. Plan ERCP tomorrow.

## 2014-05-17 NOTE — Anesthesia Preprocedure Evaluation (Addendum)
Anesthesia Evaluation  Patient identified by MRN, date of birth, ID band Patient awake    Reviewed: Allergy & Precautions, H&P , NPO status , Patient's Chart, lab work & pertinent test results  Airway       Dental   Pulmonary former smoker,          Cardiovascular hypertension,     Neuro/Psych    GI/Hepatic hiatal hernia, GERD-  ,  Endo/Other  diabetes, Type 2, Oral Hypoglycemic Agents  Renal/GU Renal InsufficiencyRenal disease     Musculoskeletal   Abdominal   Peds  Hematology   Anesthesia Other Findings   Reproductive/Obstetrics                          Anesthesia Physical Anesthesia Plan  ASA: II  Anesthesia Plan: General   Post-op Pain Management:    Induction: Intravenous  Airway Management Planned: Oral ETT  Additional Equipment:   Intra-op Plan:   Post-operative Plan: Extubation in OR  Informed Consent: I have reviewed the patients History and Physical, chart, labs and discussed the procedure including the risks, benefits and alternatives for the proposed anesthesia with the patient or authorized representative who has indicated his/her understanding and acceptance.     Plan Discussed with: CRNA, Anesthesiologist and Surgeon  Anesthesia Plan Comments:         Anesthesia Quick Evaluation

## 2014-05-17 NOTE — Transfer of Care (Signed)
Immediate Anesthesia Transfer of Care Note  Patient: [name] [name]  Procedure(s) Performed: Procedure(s): ENDOSCOPIC RETROGRADE CHOLANGIOPANCREATOGRAPHY (ERCP) (N/A)  Patient Location: Endoscopy Unit  Anesthesia Type:General  Level of Consciousness: awake, alert  and oriented  Airway & Oxygen Therapy: Patient Spontanous Breathing  Post-op Assessment: Report given to PACU RN  Post vital signs: Reviewed and stable  Complications: No apparent anesthesia complications

## 2014-05-17 NOTE — Progress Notes (Signed)
Patient 0800 CBG 76. Pt asymptomatic but NPO for ERCPand unsure of time she will be going down for procedure. Using nursing judgement 37ml of Dextrose 50% given IV to prevent further blood sugar drop. Will continue to monitor.

## 2014-05-17 NOTE — Progress Notes (Signed)
  PROGRESS NOTE MEDICINE TEACHING ATTENDING   Day 2 of stay Patient name: [name] [name]   Medical record number: [id number] Date of birth: [birth year]   Met and examined patient. No new complaints. No pain. NPO for ERCP today. Blood sugars as low as 76. Patient was given some cracker and CBG better. I have discussed the care of this patient with my IM team residents - Drs Daleen Bo and [place]. Please see the resident note for details.   [place], [place] 05/17/2014, 11:11 AM.

## 2014-05-17 NOTE — Interval H&P Note (Signed)
History and Physical Interval Note:  05/17/2014 1:02 PM  [name] [name]  has presented today for surgery, with the diagnosis of CBD stone  The various methods of treatment have been discussed with the patient and family. After consideration of risks, benefits and other options for treatment, the patient has consented to  Procedure(s): ENDOSCOPIC RETROGRADE CHOLANGIOPANCREATOGRAPHY (ERCP) (N/A) as a surgical intervention .  The patient's history has been reviewed, patient examined, no change in status, stable for surgery.  I have reviewed the patient's chart and labs.  Questions were answered to the patient's satisfaction.    The recent H&P (dated *05/16/14**) was reviewed, the patient was examined and there is no change in the patients condition since that H&P was completed.   Erskine Emery  05/17/2014, 1:02 PM    Erskine Emery

## 2014-05-17 NOTE — Progress Notes (Addendum)
Subjective:    [name] [name] continues to feel asymptomatic.  She was able to tolerate a diet last night without issue.   She denies SOB, chest pain, abdominal pain, nausea currently.  Interval Events: -Vital signs stable. -MRCP yesterday showed a small stone in the CBD.  Pulmonary nodule noted that needs follow-up. -Dr. Deatra Ina of GI plans to do ERCP today.   Objective:    Vital Signs:   Temp:  [97.7 F (36.5 C)-98.9 F (37.2 C)] 97.9 F (36.6 C) (09/04 0537) Pulse Rate:  [62-68] 64 (09/04 0537) Resp:  [18] 18 (09/04 0537) BP: (136-167)/(46-62) 146/46 mmHg (09/04 0537) SpO2:  [98 %-100 %] 99 % (09/04 0537) Last BM Date: 05/15/14  24-hour weight change: Weight change:   Intake/Output:   Intake/Output Summary (Last 24 hours) at 05/17/14 0803 Last data filed at 05/17/14 3845  Gross per 24 hour  Intake   1930 ml  Output      0 ml  Net   1930 ml      Physical Exam: General: Vital signs reviewed and noted. Well-developed, well-nourished, in no acute distress; alert, appropriate and cooperative throughout examination.  Lungs:  Normal respiratory effort. Clear to auscultation BL without crackles or wheezes.  Heart: RRR. S1 and S2 normal without gallop, murmur, or rubs.  Abdomen:  BS normoactive. Soft, nontender, nondistended. No masses or organomegaly.  Extremities: No pretibial edema.     Labs:  Basic Metabolic Panel:  Recent Labs Lab 05/15/14 1815 05/16/14 0654 05/17/14 0434  NA 140 145 144  K 3.8 4.0 4.9  CL 103 107 108  CO2 26 27 27   GLUCOSE 210* 92 108*  BUN 22 15 11   CREATININE 0.81 0.66 0.80  CALCIUM 9.1 8.7 8.4    Liver Function Tests:  Recent Labs Lab 05/15/14 1815 05/16/14 0654 05/17/14 0434  AST 140* 73* 38*  ALT 83* 78* 53*  ALKPHOS 121* 105 92  BILITOT 0.3 0.4 0.3  PROT 6.3 5.7* 5.6*  ALBUMIN 3.3* 3.0* 2.9*    Recent Labs Lab 05/15/14 1815  LIPASE 67*   CBC:  Recent Labs Lab 05/15/14 1815  WBC 7.1  HGB 12.1  HCT 36.1    MCV 85.3  PLT 158   CBG:  Recent Labs Lab 05/16/14 0655 05/16/14 1725 05/16/14 2158 05/17/14 0509 05/17/14 0745  GLUCAP 90 89 85 101* 76   Acute hepatitis panel: Negative.  Other results: EKG: normal EKG, normal sinus rhythm, unchanged from previous tracings.  Imaging: US Abdomen Complete  05/15/2014   CLINICAL DATA:  Abnormal LFTs.  EXAM: ULTRASOUND ABDOMEN COMPLETE  COMPARISON:  ERCP 05/04/2007.  FINDINGS: Gallbladder:  Cholecystectomy.  Common bile duct:  Diameter: 4 mm.  Liver:  No focal lesion identified. Within normal limits in parenchymal echogenicity.  IVC:  No abnormality visualized.  Pancreas:  Visualized portion unremarkable.  Spleen:  Size and appearance within normal limits.  Right Kidney:  Length: 10.1 cm. Echogenicity within normal limits. No mass or hydronephrosis visualized.  Left Kidney:  Length: 9.4 cm. Echogenicity within normal limits. No significant mass or hydronephrosis visualized. 3.0 cm simple left renal cell cyst.  Abdominal aorta:  No aneurysm visualized.  Other findings:  None.  IMPRESSION: 1. Cholecystectomy. No biliary distention. No focal hepatic abnormality. Hepatic echotexture normal. 2. Simple left renal cyst.   Electronically Signed   By: Marcello Moores  Register   On: 05/15/2014 20:27   Mr 3d Recon At Scanner  05/16/2014   CLINICAL DATA:  Evaluate for common  bile duct stone.  EXAM: MRI ABDOMEN WITHOUT AND WITH CONTRAST (INCLUDING MRCP)  TECHNIQUE: Multiplanar multisequence MR imaging of the abdomen was performed both before and after the administration of intravenous contrast. Heavily T2-weighted images of the biliary and pancreatic ducts were obtained, and three-dimensional MRCP images were rendered by post processing.  CONTRAST:  14 cc of MultiHance  COMPARISON:  05/15/2014  FINDINGS:  Nodule in the posterior right lung base is identified. This measures 1.9 cm, image 9/ series 5. Previously 1.4 cm.Hemangioma along the dome of liver measures 9 mm, image  11/series 3. No evidence for hepatic steatosis. Prior cholecystectomy. There is mild intrahepatic bile duct dilatation. The common bile duct measures 1 cm. 3 mm stone is identified within the distal CBD, image 13/series 7. Normal appearance of the pancreas. The spleen is unremarkable.  Normal appearance of the adrenal glands. Bilateral renal cysts are identified. Normal caliber of the abdominal aorta. No retroperitoneal adenopathy. Normal signal from within the bone marrow.  IMPRESSION: 1. Tiny stone is identified within the distal common bile duct. 2. Liver hemangioma and kidney cysts 3. Pulmonary nodule in the right base is slightly increased in size from 2008. Because this was present almost 7 years ago this is likely a benign finding. However, a slow-growing pulmonary neoplasm cannot be excluded. Recommend initial workup with a noncontrast CT of the chest for better characterization of this nodule.   Electronically Signed   By: Kerby Moors M.D.   On: 05/16/2014 17:27   Dg Chest Port 1 View  05/15/2014   CLINICAL DATA:  CHEST PAIN  EXAM: PORTABLE CHEST - 1 VIEW  COMPARISON:  02/20/2008  FINDINGS: Mild prominence of bibasilar interstitial markings. No confluent airspace infiltrate or overt interstitial edema. Heart size remains normal. Atheromatous aorta. No effusion. Visualized skeletal structures are unremarkable.  IMPRESSION: 1. Progressive prominence of bibasilar interstitial markings since 2009, without convincing acute or superimposed abnormality.   Electronically Signed   By: Arne Cleveland M.D.   On: 05/15/2014 18:29   Mr Abd W/wo Cm/mrcp  05/16/2014   CLINICAL DATA:  Evaluate for common bile duct stone.  EXAM: MRI ABDOMEN WITHOUT AND WITH CONTRAST (INCLUDING MRCP)  TECHNIQUE: Multiplanar multisequence MR imaging of the abdomen was performed both before and after the administration of intravenous contrast. Heavily T2-weighted images of the biliary and pancreatic ducts were obtained, and  three-dimensional MRCP images were rendered by post processing.  CONTRAST:  14 cc of MultiHance  COMPARISON:  05/15/2014  FINDINGS:  Nodule in the posterior right lung base is identified. This measures 1.9 cm, image 9/ series 5. Previously 1.4 cm.Hemangioma along the dome of liver measures 9 mm, image 11/series 3. No evidence for hepatic steatosis. Prior cholecystectomy. There is mild intrahepatic bile duct dilatation. The common bile duct measures 1 cm. 3 mm stone is identified within the distal CBD, image 13/series 7. Normal appearance of the pancreas. The spleen is unremarkable.  Normal appearance of the adrenal glands. Bilateral renal cysts are identified. Normal caliber of the abdominal aorta. No retroperitoneal adenopathy. Normal signal from within the bone marrow.  IMPRESSION: 1. Tiny stone is identified within the distal common bile duct. 2. Liver hemangioma and kidney cysts 3. Pulmonary nodule in the right base is slightly increased in size from 2008. Because this was present almost 7 years ago this is likely a benign finding. However, a slow-growing pulmonary neoplasm cannot be excluded. Recommend initial workup with a noncontrast CT of the chest for better characterization of this  nodule.   Electronically Signed   By: Kerby Moors M.D.   On: 05/16/2014 17:27       Medications:    Infusions: . sodium chloride 20 mL/hr at 05/17/14 0745    Scheduled Medications: . aspirin  81 mg Oral Daily  . calcium-vitamin D  1 tablet Oral BID WC  . ciprofloxacin  400 mg Intravenous Q12H  . cycloSPORINE  1 drop Both Eyes BID  . heparin  5,000 Units Subcutaneous 3 times per day  . lisinopril  20 mg Oral Daily   And  . hydrochlorothiazide  12.5 mg Oral Daily  . insulin aspart  0-5 Units Subcutaneous QHS  . insulin aspart  0-9 Units Subcutaneous TID WC  . pantoprazole  40 mg Oral Daily    PRN Medications: acetaminophen, acetaminophen, polyvinyl alcohol   Assessment/ Plan:    Principal  Problem:   Epigastric pain Active Problems:   DM type 2 (diabetes mellitus, type 2)   HYPERTENSION   GERD   Transaminitis   CKD (chronic kidney disease) stage 2, GFR 60-89 ml/min   Pulmonary nodule  #Cholodocolithiasis Asymptomatic currently, but small stone in CBD that likely caused her pain on presentation.  ERCP today.  Will follow GI recs for discharge. -ERCP this morning. -NPO until then. -Continue NS 20 ml/hr while NPO.  #Pulmonary nodule Noted on prior MRCP 7 years ago with slight increase in size currently.  Non-contrast CT recommended for better characterization. -Non-contrast CT as outpatient.  #Type 2 diabetes mellitus  CBGs stable. CBG of 76 this morning and given 25 ml D5. -Continue SSI sensitive ACHS. -Hold home metformin .  #Hypertension Patient says she takes metoprolol 25 mg at home.  Today, she says that she takes a pill twice a day.  Daughter will bring in meds to confirm.  Slightly elevated BP today at 146/46. -Start metoprolol 12.5 mg BID.  #Hyperlipidemia  Holding statin due to concern for elevated LFTs.  Now unlikely cause given gallstone. -Restart home atorvastatin 10 mg daily.   #GERD/hiatal hernia Stable, no symptoms currently. -Continue PPI.   DVT PPX - heparin  CODE STATUS - DNR.  CONSULTS PLACED - GI.  DISPO - Disposition is deferred at this time, awaiting MRCP.   Anticipated discharge in approximately 1 day(s).   The patient does have a current PCP Tamsen Roers, MD) and does not need an [facility] hospital follow-up appointment after discharge.    Is the [facility] hospital follow-up appointment a one-time only appointment? not applicable.  Does the patient have transportation limitations that hinder transportation to clinic appointments? no   SERVICE NEEDED AT [place]         Y = Yes, Blank = No PT:   OT:   RN:   Equipment:   Other:      Length of Stay: 2 day(s)   Signed: Arman Filter,  MD  PGY-1, Internal Medicine Resident Pager: [phone] (7AM-5PM) 05/17/2014, 8:03 AM

## 2014-05-17 NOTE — Anesthesia Postprocedure Evaluation (Signed)
  Anesthesia Post-op Note  Patient: [name] [name]  Procedure(s) Performed: Procedure(s): ENDOSCOPIC RETROGRADE CHOLANGIOPANCREATOGRAPHY (ERCP) (N/A)  Patient Location: PACU  Anesthesia Type:General  Level of Consciousness: awake, alert , oriented and patient cooperative  Airway and Oxygen Therapy: Patient Spontanous Breathing  Post-op Pain: none  Post-op Assessment: Post-op Vital signs reviewed, Patient's Cardiovascular Status Stable, Respiratory Function Stable, Patent Airway and No signs of Nausea or vomiting  Post-op Vital Signs: stable  Last Vitals:  Filed Vitals:   05/17/14 1500  BP: 137/60  Pulse: 74  Temp:   Resp: 14    Complications: No apparent anesthesia complications

## 2014-05-17 NOTE — Progress Notes (Signed)
Hypoglycemic Event  CBG: 76  Treatment: D50 IV 25 mL  Symptoms: None  Follow-up CBG: Time:0810 CBG Result:164  Possible Reasons for Event: Inadequate meal intake  Comments/MD notified:Patient NPO for ERCP today. No food or fluids since 2200 on 9/3.    [name], [name] Eustache P  Remember to initiate Hypoglycemia Order Set & complete

## 2014-05-17 NOTE — Progress Notes (Signed)
A single bile duct stone was extracted from the common bile duct.  Okay to begin feeding patient.  If she feels well she can be discharged.

## 2014-05-17 NOTE — Anesthesia Procedure Notes (Signed)
Procedure Name: Intubation Date/Time: 05/17/2014 1:10 PM Performed by: Ned Grace Pre-anesthesia Checklist: Patient identified, Patient being monitored, Emergency Drugs available, Timeout performed and Suction available Patient Re-evaluated:Patient Re-evaluated prior to inductionOxygen Delivery Method: Circle system utilized Preoxygenation: Pre-oxygenation with 100% oxygen Intubation Type: IV induction Ventilation: Mask ventilation without difficulty Laryngoscope Size: Mac and 4 Grade View: Grade I Tube type: Oral Tube size: 7.5 mm Number of attempts: 1 Airway Equipment and Method: Stylet Placement Confirmation: ETT inserted through vocal cords under direct vision,  breath sounds checked- equal and bilateral and positive ETCO2 Secured at: 21 cm Tube secured with: Tape Dental Injury: Teeth and Oropharynx as per pre-operative assessment

## 2014-05-18 DIAGNOSIS — E46 Unspecified protein-calorie malnutrition: Secondary | ICD-10-CM | POA: Diagnosis present

## 2014-05-18 LAB — COMPREHENSIVE METABOLIC PANEL
ALT: 866 U/L — ABNORMAL HIGH (ref 0–35)
AST: 879 U/L — AB (ref 0–37)
Albumin: 2.9 g/dL — ABNORMAL LOW (ref 3.5–5.2)
Alkaline Phosphatase: 241 U/L — ABNORMAL HIGH (ref 39–117)
Anion gap: 11 (ref 5–15)
BUN: 11 mg/dL (ref 6–23)
CALCIUM: 9.1 mg/dL (ref 8.4–10.5)
CHLORIDE: 103 meq/L (ref 96–112)
CO2: 28 mEq/L (ref 19–32)
Creatinine, Ser: 0.87 mg/dL (ref 0.50–1.10)
GFR calc Af Amer: 68 mL/min — ABNORMAL LOW (ref >90)
GFR calc non Af Amer: 59 mL/min — ABNORMAL LOW (ref >90)
Glucose, Bld: 153 mg/dL — ABNORMAL HIGH (ref 70–99)
Potassium: 4.2 mEq/L (ref 3.7–5.3)
Sodium: 142 mEq/L (ref 137–147)
TOTAL PROTEIN: 5.9 g/dL — AB (ref 6.0–8.3)
Total Bilirubin: 0.9 mg/dL (ref 0.3–1.2)

## 2014-05-18 LAB — GLUCOSE, CAPILLARY
GLUCOSE-CAPILLARY: 104 mg/dL — AB (ref 70–99)
GLUCOSE-CAPILLARY: 134 mg/dL — AB (ref 70–99)
Glucose-Capillary: 124 mg/dL — ABNORMAL HIGH (ref 70–99)
Glucose-Capillary: 131 mg/dL — ABNORMAL HIGH (ref 70–99)

## 2014-05-18 LAB — AMYLASE: AMYLASE: 77 U/L (ref 0–105)

## 2014-05-18 LAB — LIPASE, BLOOD: Lipase: 48 U/L (ref 11–59)

## 2014-05-18 NOTE — Discharge Summary (Signed)
Name: [name] [name] MRN: [id number] DOB: 10/06/1925 80 y.o. PCP: Tamsen Roers, MD  Date of Admission: 05/15/2014  5:31 PM Date of Discharge: 05/19/2014 Attending Physician: Madilyn Fireman, MD  Discharge Diagnosis: 1. Choledocholithiasis with transaminitis  2. Pulmonary nodule  3. DM type 2 (diabetes mellitus, type 2)  4. HYPERTENSION  5. GERD   Discharge Medications:   Medication List    STOP taking these medications       atorvastatin 10 MG tablet  Commonly known as:  LIPITOR      TAKE these medications       aspirin 81 MG tablet  Take 81 mg by mouth daily.     calcium citrate-vitamin D 315-200 MG-UNIT per tablet  Commonly known as:  CITRACAL+D  Take 1 tablet by mouth 2 (two) times daily.     cycloSPORINE 0.05 % ophthalmic emulsion  Commonly known as:  RESTASIS  Place 1 drop into both eyes 2 (two) times daily.     ergocalciferol 50000 UNITS capsule  Commonly known as:  VITAMIN D2  Take 50,000 Units by mouth once a week. On saturdays     lisinopril-hydrochlorothiazide 20-12.5 MG per tablet  Commonly known as:  PRINZIDE,ZESTORETIC  Take 1 tablet by mouth daily.     metFORMIN 500 MG tablet  Commonly known as:  GLUCOPHAGE  Take 500 mg by mouth daily with breakfast.     metoprolol tartrate 25 MG tablet  Commonly known as:  LOPRESSOR  Take 0.5 tablets (12.5 mg total) by mouth 2 (two) times daily.     omega-3 acid ethyl esters 1 G capsule  Commonly known as:  LOVAZA  Take 1 g by mouth daily.     omeprazole 20 MG tablet  Commonly known as:  PRILOSEC OTC  Take 20 mg by mouth daily.     SYSTANE 0.4-0.3 % Soln  Generic drug:  Polyethyl Glycol-Propyl Glycol  Place 1 drop into both eyes 3 (three) times daily as needed (dry eyes).        Disposition and follow-up:   Ms.Jodi T Riggenbach was discharged from [facility] in stable condition.  At the hospital follow up visit please address:  1.   -needs non contrast CT outpatient for pulmonary  nodule  -repeat CMET for resolution of transaminitis -assess need for statin  2.  Labs / imaging needed at time of follow-up:  See above   3.  Pending labs/ test needing follow-up:  None   Follow-up Appointments: Follow-up Information   Follow up with Tamsen Roers, MD On 05/28/2014. (10:00 am)    Specialty:  Family Medicine   Contact information:   [address] [place] [address] [phone]       Follow up with Milus Banister, MD On 07/17/2014. (10:00)    Specialty:  Gastroenterology   Contact information:   [address] [place] [address] [phone]       Discharge Instructions:  Please call your PCP if you have any questions or concerns, or any difficulty getting any of your medications.  Please return to the ER if you have worsening of your symptoms or new severe symptoms arise.  You need to have your liver function tests rechecked to make sure they are still improving.  Please ask your PCP to check these at your appointment on 05/28/14.  You are scheduled to follow up with you GI doctor in November, but have your PCP call and get you in sooner if  your lab tests are not improving. Discharge Instructions   Call MD for:  difficulty breathing, headache or visual disturbances    Complete by:  As directed      Call MD for:  extreme fatigue    Complete by:  As directed      Call MD for:  persistant dizziness or light-headedness    Complete by:  As directed      Call MD for:  persistant nausea and vomiting    Complete by:  As directed      Call MD for:  severe uncontrolled pain    Complete by:  As directed      Call MD for:  temperature >100.4    Complete by:  As directed      Diet - low sodium heart healthy    Complete by:  As directed      Discharge instructions    Complete by:  As directed   Please follow up with your regular doctor and gastroenterology     Increase activity slowly    Complete by:  As directed            Consultations:  Treatment Team:  Inda Castle, MD  Procedures Performed:  US Abdomen Complete  05/15/2014   CLINICAL DATA:  Abnormal LFTs.  EXAM: ULTRASOUND ABDOMEN COMPLETE  COMPARISON:  ERCP 05/04/2007.  FINDINGS: Gallbladder:  Cholecystectomy.  Common bile duct:  Diameter: 4 mm.  Liver:  No focal lesion identified. Within normal limits in parenchymal echogenicity.  IVC:  No abnormality visualized.  Pancreas:  Visualized portion unremarkable.  Spleen:  Size and appearance within normal limits.  Right Kidney:  Length: 10.1 cm. Echogenicity within normal limits. No mass or hydronephrosis visualized.  Left Kidney:  Length: 9.4 cm. Echogenicity within normal limits. No significant mass or hydronephrosis visualized. 3.0 cm simple left renal cell cyst.  Abdominal aorta:  No aneurysm visualized.  Other findings:  None.  IMPRESSION: 1. Cholecystectomy. No biliary distention. No focal hepatic abnormality. Hepatic echotexture normal. 2. Simple left renal cyst.   Electronically Signed   By: Marcello Moores  Register   On: 05/15/2014 20:27   Mr 3d Recon At Scanner  05/16/2014   CLINICAL DATA:  Evaluate for common bile duct stone.  EXAM: MRI ABDOMEN WITHOUT AND WITH CONTRAST (INCLUDING MRCP)  TECHNIQUE: Multiplanar multisequence MR imaging of the abdomen was performed both before and after the administration of intravenous contrast. Heavily T2-weighted images of the biliary and pancreatic ducts were obtained, and three-dimensional MRCP images were rendered by post processing.  CONTRAST:  14 cc of MultiHance  COMPARISON:  05/15/2014  FINDINGS:  Nodule in the posterior right lung base is identified. This measures 1.9 cm, image 9/ series 5. Previously 1.4 cm.Hemangioma along the dome of liver measures 9 mm, image 11/series 3. No evidence for hepatic steatosis. Prior cholecystectomy. There is mild intrahepatic bile duct dilatation. The common bile duct measures 1 cm. 3 mm stone is identified within the distal CBD, image 13/series 7. Normal  appearance of the pancreas. The spleen is unremarkable.  Normal appearance of the adrenal glands. Bilateral renal cysts are identified. Normal caliber of the abdominal aorta. No retroperitoneal adenopathy. Normal signal from within the bone marrow.  IMPRESSION: 1. Tiny stone is identified within the distal common bile duct. 2. Liver hemangioma and kidney cysts 3. Pulmonary nodule in the right base is slightly increased in size from 2008. Because this was present almost 7 years ago this is likely a benign finding.  However, a slow-growing pulmonary neoplasm cannot be excluded. Recommend initial workup with a noncontrast CT of the chest for better characterization of this nodule.   Electronically Signed   By: Kerby Moors M.D.   On: 05/16/2014 17:27   Dg Chest Port 1 View  05/15/2014   CLINICAL DATA:  CHEST PAIN  EXAM: PORTABLE CHEST - 1 VIEW  COMPARISON:  02/20/2008  FINDINGS: Mild prominence of bibasilar interstitial markings. No confluent airspace infiltrate or overt interstitial edema. Heart size remains normal. Atheromatous aorta. No effusion. Visualized skeletal structures are unremarkable.  IMPRESSION: 1. Progressive prominence of bibasilar interstitial markings since 2009, without convincing acute or superimposed abnormality.   Electronically Signed   By: Arne Cleveland M.D.   On: 05/15/2014 18:29   Dg Ercp Biliary & Pancreatic Ducts  05/17/2014   CLINICAL DATA:  Bile duct stone.  EXAM: ERCP .  TECHNIQUE: Multiple spot images obtained with the fluoroscopic device and submitted for interpretation post-procedure.  COMPARISON:  MR of May 16, 2014.  FINDINGS: Five fluoroscopic images were submitted for review. These images demonstrate a balloon catheter being passed through the common bile duct. No definite filling defect is seen on the final image.  IMPRESSION: See above.  These images were submitted for radiologic interpretation only. Please see the procedural report for the amount of contrast and the  fluoroscopy time utilized.   Electronically Signed   By: Sabino Dick M.D.   On: 05/17/2014 14:04   Mr Abd W/wo Cm/mrcp  05/16/2014   CLINICAL DATA:  Evaluate for common bile duct stone.  EXAM: MRI ABDOMEN WITHOUT AND WITH CONTRAST (INCLUDING MRCP)  TECHNIQUE: Multiplanar multisequence MR imaging of the abdomen was performed both before and after the administration of intravenous contrast. Heavily T2-weighted images of the biliary and pancreatic ducts were obtained, and three-dimensional MRCP images were rendered by post processing.  CONTRAST:  14 cc of MultiHance  COMPARISON:  05/15/2014  FINDINGS:  Nodule in the posterior right lung base is identified. This measures 1.9 cm, image 9/ series 5. Previously 1.4 cm.Hemangioma along the dome of liver measures 9 mm, image 11/series 3. No evidence for hepatic steatosis. Prior cholecystectomy. There is mild intrahepatic bile duct dilatation. The common bile duct measures 1 cm. 3 mm stone is identified within the distal CBD, image 13/series 7. Normal appearance of the pancreas. The spleen is unremarkable.  Normal appearance of the adrenal glands. Bilateral renal cysts are identified. Normal caliber of the abdominal aorta. No retroperitoneal adenopathy. Normal signal from within the bone marrow.  IMPRESSION: 1. Tiny stone is identified within the distal common bile duct. 2. Liver hemangioma and kidney cysts 3. Pulmonary nodule in the right base is slightly increased in size from 2008. Because this was present almost 7 years ago this is likely a benign finding. However, a slow-growing pulmonary neoplasm cannot be excluded. Recommend initial workup with a noncontrast CT of the chest for better characterization of this nodule.   Electronically Signed   By: Kerby Moors M.D.   On: 05/16/2014 17:27    Admission HPI:  Chief Complaint: Epigastric pain  History of Present Illness:  Ms. Calix is a 80 year old female with a history of type 2 diabetes, hypertension,  hyperlipidemia, GERD, hiatal hernia, cholecystectomy who presents with epigastric pain. Patient states that around 3 PM today, she was having [place] and coffee when she started having intense epigastric pain. She states that it was a 200 out of 10 in severity, a sensation of tightness,  and radiated to her back. She says the pain then resolved but then recurred prompted her to be brought into the hospital. She had associated nausea without any vomiting. She denies any recent fevers, chills, chest pain, shortness of breath, diarrhea, constipation. Since her initial evaluation in the field, patient's pain has resolved again. Patient currently is asymptomatic in the emergency department.   Review of Systems:  Pertinent items are noted in HPI.  Physical Exam:  Blood pressure 129/42, pulse 61, temperature 98.5 F (36.9 C), resp. rate 18, height 5' 2"  (1.575 m), weight 64.411 kg (142 lb), SpO2 96.00%.  General: resting in bed  HEENT: PERRL, EOMI, no scleral icterus, OS STATUS post corneal transplant, OD with peripheral area of the iris with different coloration than the rest of the iris  Cardiac: RRR, no rubs, murmurs or gallops  Pulm: clear to auscultation bilaterally, moving normal volumes of air  Abd: soft, nontender, nondistended, BS present  Ext: warm and well perfused, no pedal edema  Neuro: alert and oriented X3, cranial nerves II-XII grossly intact  Hospital Course by problem list: 1. Choledocholithiasis with transaminitis  2. Pulmonary nodule  3. DM type 2 (diabetes mellitus, type 2)  4. HYPERTENSION  5. GERD   80 y.o s/p cholecytectomy 2008 presented with epigastric pain and elevated lfts evaluated with MRCP with tiny stone s/p ERCP 05/17/14 with extraction. Doing well   1. Choledocholithiasis with transaminitis  Status post MRCP and ERCP on 9/4 with extraction of stone by Dr. Deatra Ina.  Elevated AST, ALT, alk phos post-procedure, trending down at discharge.  Need to be rechecked by PCP  to confirm resolution.  2. Pulmonary nodule  Increased in size since 2008 needs non contrast CT outpatient for characterization.  3. DM type 2 (diabetes mellitus, type 2)  SSI-Sensitive with meals and qhs. We resumed home medications at discharge.   4. HYPERTENSION  Controlled. Continued Lisinopril 20, HCTZ 12.5, Lopressor 25 mg daily changed to 12.5 bid this admission.     5. GERD  Continued Protonix.  Discharge Vitals:   BP 143/45  Pulse 65  Temp(Src) 98.1 F (36.7 C) (Oral)  Resp 16  Ht 5' 3"  (1.6 m)  Wt 143 lb 3.2 oz (64.955 kg)  BMI 25.37 kg/m2  SpO2 97%  Discharge physical exam  General:[place]/at, no scleral icterus  Cardiac: RRR, no rubs, murmurs or gallops  Pulm: clear to auscultation bilaterally, no wheezes, rales, or rhonchi  Abd: soft, nontender, nondistended, BS present  Ext: warm and well perfused, no pedal edema  Neuro: alert and oriented X3, cranial nerves II-XII grossly intact  Discharge Labs:  Results for orders placed during the hospital encounter of 05/15/14 (from the past 24 hour(s))  GLUCOSE, CAPILLARY     Status: Abnormal   Collection Time    05/18/14  4:31 PM      Result Value Ref Range   Glucose-Capillary 134 (*) 70 - 99 mg/dL  GLUCOSE, CAPILLARY     Status: Abnormal   Collection Time    05/18/14  9:45 PM      Result Value Ref Range   Glucose-Capillary 131 (*) 70 - 99 mg/dL  COMPREHENSIVE METABOLIC PANEL     Status: Abnormal   Collection Time    05/19/14  4:05 AM      Result Value Ref Range   Sodium 142  137 - 147 mEq/L   Potassium 4.4  3.7 - 5.3 mEq/L   Chloride 102  96 - 112 mEq/L   CO2  29  19 - 32 mEq/L   Glucose, Bld 129 (*) 70 - 99 mg/dL   BUN 15  6 - 23 mg/dL   Creatinine, Ser 0.80  0.50 - 1.10 mg/dL   Calcium 9.3  8.4 - 10.5 mg/dL   Total Protein 6.0  6.0 - 8.3 g/dL   Albumin 3.0 (*) 3.5 - 5.2 g/dL   AST 401 (*) 0 - 37 U/L   ALT 637 (*) 0 - 35 U/L   Alkaline Phosphatase 242 (*) 39 - 117 U/L   Total Bilirubin 0.6  0.3 - 1.2 mg/dL    GFR calc non Af Amer 65 (*) >90 mL/min   GFR calc Af Amer 75 (*) >90 mL/min   Anion gap 11  5 - 15  CBC WITH DIFFERENTIAL     Status: Abnormal   Collection Time    05/19/14  4:05 AM      Result Value Ref Range   WBC 4.6  4.0 - 10.5 K/uL   RBC 4.02  3.87 - 5.11 MIL/uL   Hemoglobin 11.7 (*) 12.0 - 15.0 g/dL   HCT 35.1 (*) 36.0 - 46.0 %   MCV 87.3  78.0 - 100.0 fL   MCH 29.1  26.0 - 34.0 pg   MCHC 33.3  30.0 - 36.0 g/dL   RDW 13.8  11.5 - 15.5 %   Platelets 123 (*) 150 - 400 K/uL   Neutrophils Relative % 62  43 - 77 %   Neutro Abs 2.9  1.7 - 7.7 K/uL   Lymphocytes Relative 26  12 - 46 %   Lymphs Abs 1.2  0.7 - 4.0 K/uL   Monocytes Relative 8  3 - 12 %   Monocytes Absolute 0.4  0.1 - 1.0 K/uL   Eosinophils Relative 3  0 - 5 %   Eosinophils Absolute 0.2  0.0 - 0.7 K/uL   Basophils Relative 1  0 - 1 %   Basophils Absolute 0.0  0.0 - 0.1 K/uL  GLUCOSE, CAPILLARY     Status: Abnormal   Collection Time    05/19/14  6:43 AM      Result Value Ref Range   Glucose-Capillary 113 (*) 70 - 99 mg/dL   Results for LYLIANA, DICENSO (MRN [id number]) as of 05/18/2014 09:57  Ref. Range 05/15/2014 18:15 05/15/2014 18:22 05/16/2014 06:54 05/17/2014 04:34  Sodium Latest Range: 137-147 mEq/L 140  145 144  Potassium Latest Range: 3.7-5.3 mEq/L 3.8  4.0 4.9  Chloride Latest Range: 96-112 mEq/L 103  107 108  CO2 Latest Range: 19-32 mEq/L 26  27 27   BUN Latest Range: 6-23 mg/dL 22  15 11   Creatinine Latest Range: 0.50-1.10 mg/dL 0.81  0.66 0.80  Calcium Latest Range: 8.4-10.5 mg/dL 9.1  8.7 8.4  GFR calc non Af Amer Latest Range: >90 mL/min 64 (L)  78 (L) 65 (L)  GFR calc Af Amer Latest Range: >90 mL/min 74 (L)  >90 75 (L)  Glucose Latest Range: 70-99 mg/dL 210 (H)  92 108 (H)  Anion gap Latest Range: 5-15  11  11 9   Alkaline Phosphatase Latest Range: 39-117 U/L 121 (H)  105 92  Albumin Latest Range: 3.5-5.2 g/dL 3.3 (L)  3.0 (L) 2.9 (L)  Lipase Latest Range: 11-59 U/L 67 (H)     AST Latest Range: 0-37 U/L  140 (H)  73 (H) 38 (H)  ALT Latest Range: 0-35 U/L 83 (H)  78 (H) 53 (H)  Total Protein Latest Range: 6.0-8.3  g/dL 6.3  5.7 (L) 5.6 (L)  Total Bilirubin Latest Range: 0.3-1.2 mg/dL 0.3  0.4 0.3  Troponin i, poc Latest Range: 0.00-0.08 ng/mL  0.00    WBC Latest Range: 4.0-10.5 K/uL 7.1     RBC Latest Range: 3.87-5.11 MIL/uL 4.23     Hemoglobin Latest Range: 12.0-15.0 g/dL 12.1     HCT Latest Range: 36.0-46.0 % 36.1     MCV Latest Range: 78.0-100.0 fL 85.3     MCH Latest Range: 26.0-34.0 pg 28.6     MCHC Latest Range: 30.0-36.0 g/dL 33.5     RDW Latest Range: 11.5-15.5 % 13.4     Platelets Latest Range: 150-400 K/uL 158     Results for JACAYLA, NORDELL (MRN [id number]) as of 05/18/2014 09:57  Ref. Range 05/16/2014 06:54  Hep A IgM Latest Range: NON REACTIVE  NON REACTIVE  Hepatitis B Surface Ag Latest Range: NEGATIVE  NEGATIVE  Hep B C IgM Latest Range: NON REACTIVE  NON REACTIVE  HCV Ab Latest Range: NEGATIVE  NEGATIVE     Signed: Arman Filter, MD 05/19/2014, 12:48 PM    Services Ordered on Discharge: none Equipment Ordered on Discharge: none

## 2014-05-18 NOTE — Progress Notes (Signed)
Md states ok to leave IV out even though her discharge orders were discontinued, family is aware

## 2014-05-18 NOTE — Progress Notes (Signed)
Patient was to discharge today, however, liver enzymes were elevated, nonsymptomatic, continuing to monitor

## 2014-05-18 NOTE — Progress Notes (Signed)
Unassigned patient Subjective: Patient seems to be comfortable after having an ERCP with stone extraction done by Dr. Deatra Ina yesterday. She was ready to go home this afternoon but her repeat CMP revealed elevated LFT's with normal levels or Amylase and Lipase.   Objective: Vital signs in last 24 hours: Temp:  [98 F (36.7 C)-98.7 F (37.1 C)] 98.7 F (37.1 C) (09/05 0533) Pulse Rate:  [58-79] 66 (09/05 0533) Resp:  [10-17] 16 (09/05 0533) BP: (123-169)/(39-60) 128/39 mmHg (09/05 0533) SpO2:  [97 %-100 %] 97 % (09/05 0533) Last BM Date: 05/15/14  Intake/Output from previous day: 09/04 0701 - 09/05 0700 In: 740 [I.V.:740] Out: -  Intake/Output this shift:   General appearance: alert, cooperative, appears stated age, no distress Resp: clear to auscultation bilaterally Cardio: regular rate and rhythm, S1, S2 normal, no murmur, click, rub or gallop GI: soft, non-tender; bowel sounds normal; no masses,  no organomegaly Extremities: extremities normal, atraumatic, no cyanosis or edema  Lab Results:  Recent Labs  05/15/14 1815  WBC 7.1  HGB 12.1  HCT 36.1  PLT 158   BMET  Recent Labs  05/16/14 0654 05/17/14 0434 05/18/14 0945  NA 145 144 142  K 4.0 4.9 4.2  CL 107 108 103  CO2 27 27 28   GLUCOSE 92 108* 153*  BUN 15 11 11   CREATININE 0.66 0.80 0.87  CALCIUM 8.7 8.4 9.1   LFT  Recent Labs  05/18/14 0945  PROT 5.9*  ALBUMIN 2.9*  AST 879*  ALT 866*  ALKPHOS 241*  BILITOT 0.9   Hepatitis Panel  Recent Labs  05/16/14 0654  HEPBSAG NEGATIVE  HCVAB NEGATIVE  HEPAIGM NON REACTIVE  HEPBIGM NON REACTIVE   Studies/Results: Mr 3d Recon At Scanner  05/16/2014   CLINICAL DATA:  Evaluate for common bile duct stone.  EXAM: MRI ABDOMEN WITHOUT AND WITH CONTRAST (INCLUDING MRCP)  TECHNIQUE: Multiplanar multisequence MR imaging of the abdomen was performed both before and after the administration of intravenous contrast. Heavily T2-weighted images of the biliary and  pancreatic ducts were obtained, and three-dimensional MRCP images were rendered by post processing.  CONTRAST:  14 cc of MultiHance  COMPARISON:  05/15/2014  FINDINGS:  Nodule in the posterior right lung base is identified. This measures 1.9 cm, image 9/ series 5. Previously 1.4 cm.Hemangioma along the dome of liver measures 9 mm, image 11/series 3. No evidence for hepatic steatosis. Prior cholecystectomy. There is mild intrahepatic bile duct dilatation. The common bile duct measures 1 cm. 3 mm stone is identified within the distal CBD, image 13/series 7. Normal appearance of the pancreas. The spleen is unremarkable.  Normal appearance of the adrenal glands. Bilateral renal cysts are identified. Normal caliber of the abdominal aorta. No retroperitoneal adenopathy. Normal signal from within the bone marrow.  IMPRESSION: 1. Tiny stone is identified within the distal common bile duct. 2. Liver hemangioma and kidney cysts 3. Pulmonary nodule in the right base is slightly increased in size from 2008. Because this was present almost 7 years ago this is likely a benign finding. However, a slow-growing pulmonary neoplasm cannot be excluded. Recommend initial workup with a noncontrast CT of the chest for better characterization of this nodule.   Electronically Signed   By: Kerby Moors M.D.   On: 05/16/2014 17:27   Dg Ercp Biliary & Pancreatic Ducts  05/17/2014   CLINICAL DATA:  Bile duct stone.  EXAM: ERCP .  TECHNIQUE: Multiple spot images obtained with the fluoroscopic device and submitted for  interpretation post-procedure.  COMPARISON:  MR of May 16, 2014.  FINDINGS: Five fluoroscopic images were submitted for review. These images demonstrate a balloon catheter being passed through the common bile duct. No definite filling defect is seen on the final image.  IMPRESSION: See above.  These images were submitted for radiologic interpretation only. Please see the procedural report for the amount of contrast and the  fluoroscopy time utilized.   Electronically Signed   By: Sabino Dick M.D.   On: 05/17/2014 14:04   Mr Abd W/wo Cm/mrcp  05/16/2014   CLINICAL DATA:  Evaluate for common bile duct stone.  EXAM: MRI ABDOMEN WITHOUT AND WITH CONTRAST (INCLUDING MRCP)  TECHNIQUE: Multiplanar multisequence MR imaging of the abdomen was performed both before and after the administration of intravenous contrast. Heavily T2-weighted images of the biliary and pancreatic ducts were obtained, and three-dimensional MRCP images were rendered by post processing.  CONTRAST:  14 cc of MultiHance  COMPARISON:  05/15/2014  FINDINGS:  Nodule in the posterior right lung base is identified. This measures 1.9 cm, image 9/ series 5. Previously 1.4 cm.Hemangioma along the dome of liver measures 9 mm, image 11/series 3. No evidence for hepatic steatosis. Prior cholecystectomy. There is mild intrahepatic bile duct dilatation. The common bile duct measures 1 cm. 3 mm stone is identified within the distal CBD, image 13/series 7. Normal appearance of the pancreas. The spleen is unremarkable.  Normal appearance of the adrenal glands. Bilateral renal cysts are identified. Normal caliber of the abdominal aorta. No retroperitoneal adenopathy. Normal signal from within the bone marrow.  IMPRESSION: 1. Tiny stone is identified within the distal common bile duct. 2. Liver hemangioma and kidney cysts 3. Pulmonary nodule in the right base is slightly increased in size from 2008. Because this was present almost 7 years ago this is likely a benign finding. However, a slow-growing pulmonary neoplasm cannot be excluded. Recommend initial workup with a noncontrast CT of the chest for better characterization of this nodule.   Electronically Signed   By: Kerby Moors M.D.   On: 05/16/2014 17:27   Medications: I have reviewed the patient's current medications.  Assessment/Plan: 1) Abnormal LFT's after an ERCP [for choledocholithiasis]  With stone extraction. I am not  sure as to why her LFT's have increased so dramatically. As per my discussion with the resident, plans are to recheck her LFT's tomorrow and make further recommendations as needed.  2) GERD/Hiatal hernia-on PPI's.  3) AODM.   4) HTN, 5) Pulmonary nodule to be followed on an OP basis. 6) Malnutrition.  LOS: 3 days   Keimani Laufer 05/18/2014, 11:30 AM

## 2014-05-18 NOTE — Progress Notes (Addendum)
Subjective: Feeling well denies nausea/vomiting/abdominal pain. Tolerated diet   Objective: Vital signs in last 24 hours: Filed Vitals:   05/17/14 1500 05/17/14 2140 05/17/14 2145 05/18/14 0533  BP: 137/60 128/45 138/53 128/39  Pulse: 74 77 74 66  Temp:  98 F (36.7 C)  98.7 F (37.1 C)  TempSrc:      Resp: 14 16  16   Height:      Weight:      SpO2: 99% 100%  97%   Weight change:   Intake/Output Summary (Last 24 hours) at 05/18/14 1151 Last data filed at 05/18/14 0630  Gross per 24 hour  Intake    740 ml  Output      0 ml  Net    740 ml   Vitals reviewed. General:McDonald/at, no scleral icterus Cardiac: RRR, no rubs, murmurs or gallops Pulm: clear to auscultation bilaterally, no wheezes, rales, or rhonchi Abd: soft, nontender, nondistended, BS present Ext: warm and well perfused, no pedal edema Neuro: alert and oriented X3, cranial nerves II-XII grossly intact   Lab Results: Basic Metabolic Panel:  Recent Labs Lab 05/17/14 0434 05/18/14 0945  NA 144 142  K 4.9 4.2  CL 108 103  CO2 27 28  GLUCOSE 108* 153*  BUN 11 11  CREATININE 0.80 0.87  CALCIUM 8.4 9.1   Liver Function Tests:  Recent Labs Lab 05/17/14 0434 05/18/14 0945  AST 38* 879*  ALT 53* 866*  ALKPHOS 92 241*  BILITOT 0.3 0.9  PROT 5.6* 5.9*  ALBUMIN 2.9* 2.9*    Recent Labs Lab 05/15/14 1815  LIPASE 67*   CBC:  Recent Labs Lab 05/15/14 1815  WBC 7.1  HGB 12.1  HCT 36.1  MCV 85.3  PLT 158   CBG:  Recent Labs Lab 05/17/14 0814 05/17/14 1243 05/17/14 1629 05/17/14 2130 05/18/14 0621 05/18/14 1140  GLUCAP 164* 111* 114* 184* 104* 124*   Misc. Labs: None  Studies/Results: Mr 3d Recon At Scanner  05/16/2014   CLINICAL DATA:  Evaluate for common bile duct stone.  EXAM: MRI ABDOMEN WITHOUT AND WITH CONTRAST (INCLUDING MRCP)  TECHNIQUE: Multiplanar multisequence MR imaging of the abdomen was performed both before and after the administration of intravenous contrast. Heavily  T2-weighted images of the biliary and pancreatic ducts were obtained, and three-dimensional MRCP images were rendered by post processing.  CONTRAST:  14 cc of MultiHance  COMPARISON:  05/15/2014  FINDINGS:  Nodule in the posterior right lung base is identified. This measures 1.9 cm, image 9/ series 5. Previously 1.4 cm.Hemangioma along the dome of liver measures 9 mm, image 11/series 3. No evidence for hepatic steatosis. Prior cholecystectomy. There is mild intrahepatic bile duct dilatation. The common bile duct measures 1 cm. 3 mm stone is identified within the distal CBD, image 13/series 7. Normal appearance of the pancreas. The spleen is unremarkable.  Normal appearance of the adrenal glands. Bilateral renal cysts are identified. Normal caliber of the abdominal aorta. No retroperitoneal adenopathy. Normal signal from within the bone marrow.  IMPRESSION: 1. Tiny stone is identified within the distal common bile duct. 2. Liver hemangioma and kidney cysts 3. Pulmonary nodule in the right base is slightly increased in size from 2008. Because this was present almost 7 years ago this is likely a benign finding. However, a slow-growing pulmonary neoplasm cannot be excluded. Recommend initial workup with a noncontrast CT of the chest for better characterization of this nodule.   Electronically Signed   By: Queen Slough.D.  On: 05/16/2014 17:27   Dg Ercp Biliary & Pancreatic Ducts  05/17/2014   CLINICAL DATA:  Bile duct stone.  EXAM: ERCP .  TECHNIQUE: Multiple spot images obtained with the fluoroscopic device and submitted for interpretation post-procedure.  COMPARISON:  MR of May 16, 2014.  FINDINGS: Five fluoroscopic images were submitted for review. These images demonstrate a balloon catheter being passed through the common bile duct. No definite filling defect is seen on the final image.  IMPRESSION: See above.  These images were submitted for radiologic interpretation only. Please see the procedural  report for the amount of contrast and the fluoroscopy time utilized.   Electronically Signed   By: Sabino Dick M.D.   On: 05/17/2014 14:04   Mr Abd W/wo Cm/mrcp  05/16/2014   CLINICAL DATA:  Evaluate for common bile duct stone.  EXAM: MRI ABDOMEN WITHOUT AND WITH CONTRAST (INCLUDING MRCP)  TECHNIQUE: Multiplanar multisequence MR imaging of the abdomen was performed both before and after the administration of intravenous contrast. Heavily T2-weighted images of the biliary and pancreatic ducts were obtained, and three-dimensional MRCP images were rendered by post processing.  CONTRAST:  14 cc of MultiHance  COMPARISON:  05/15/2014  FINDINGS:  Nodule in the posterior right lung base is identified. This measures 1.9 cm, image 9/ series 5. Previously 1.4 cm.Hemangioma along the dome of liver measures 9 mm, image 11/series 3. No evidence for hepatic steatosis. Prior cholecystectomy. There is mild intrahepatic bile duct dilatation. The common bile duct measures 1 cm. 3 mm stone is identified within the distal CBD, image 13/series 7. Normal appearance of the pancreas. The spleen is unremarkable.  Normal appearance of the adrenal glands. Bilateral renal cysts are identified. Normal caliber of the abdominal aorta. No retroperitoneal adenopathy. Normal signal from within the bone marrow.  IMPRESSION: 1. Tiny stone is identified within the distal common bile duct. 2. Liver hemangioma and kidney cysts 3. Pulmonary nodule in the right base is slightly increased in size from 2008. Because this was present almost 7 years ago this is likely a benign finding. However, a slow-growing pulmonary neoplasm cannot be excluded. Recommend initial workup with a noncontrast CT of the chest for better characterization of this nodule.   Electronically Signed   By: Kerby Moors M.D.   On: 05/16/2014 17:27   Medications:  Scheduled Meds: . aspirin  81 mg Oral Daily  . atorvastatin  10 mg Oral Daily  . calcium-vitamin D  1 tablet Oral  BID WC  . cycloSPORINE  1 drop Both Eyes BID  . lisinopril  20 mg Oral Daily   And  . hydrochlorothiazide  12.5 mg Oral Daily  . insulin aspart  0-5 Units Subcutaneous QHS  . insulin aspart  0-9 Units Subcutaneous TID WC  . metoprolol tartrate  12.5 mg Oral BID  . pantoprazole  40 mg Oral Daily   Continuous Infusions:   PRN Meds:.acetaminophen, acetaminophen, polyvinyl alcohol Assessment/Plan: 80 y.o s/p cholecytectomy 2008 presented with epigastric pain and elevated lfts evaluated with MRCP with tiny stone s/p ERCP 05/17/14 with extraction. Doing well   #Choledocholithiasis with transaminitis  -s/p ERCP 9/4 with extraction of stone -will need f/u with GI outpatient, PCP for repeat CMET -discharge was later today but with significantly elevated lfts will speak with GI   #Transaminitis  -AP (241), AST (879), ALT (866) significantly elevated today, will add on amylase and lipase  -will touch base with GI   #Pulmonary nodule -increased in size needs non contrast  CT outpatient   #DM type 2 (diabetes mellitus, type 2) -SSI-S and qhs, will resume home meds at discharge   #HYPERTENSION -Lisinopril 20, HCTZ 12.5, Lopressor 12.5 bid   #GERD -protonix   #malnutrition   #F/E/N -d/c IVF -CMET pending  -carb mod diet   #DVT px  -scds   Dispo: possible later today d/c or when liver enzymes improve will ask GI   The patient does have a current PCP Tamsen Roers, MD) and does not need an [facility] hospital follow-up appointment after discharge.  The patient does not have transportation limitations that hinder transportation to clinic appointments.  .Services Needed at time of discharge: Y = Yes, Blank = No PT:   OT:   RN:   Equipment:   Other:     LOS: 3 days   Cresenciano Genre, MD [phone] 05/18/2014, 11:51 AM

## 2014-05-19 LAB — CBC WITH DIFFERENTIAL/PLATELET
Basophils Absolute: 0 10*3/uL (ref 0.0–0.1)
Basophils Relative: 1 % (ref 0–1)
EOS ABS: 0.2 10*3/uL (ref 0.0–0.7)
Eosinophils Relative: 3 % (ref 0–5)
HEMATOCRIT: 35.1 % — AB (ref 36.0–46.0)
HEMOGLOBIN: 11.7 g/dL — AB (ref 12.0–15.0)
LYMPHS ABS: 1.2 10*3/uL (ref 0.7–4.0)
Lymphocytes Relative: 26 % (ref 12–46)
MCH: 29.1 pg (ref 26.0–34.0)
MCHC: 33.3 g/dL (ref 30.0–36.0)
MCV: 87.3 fL (ref 78.0–100.0)
MONO ABS: 0.4 10*3/uL (ref 0.1–1.0)
MONOS PCT: 8 % (ref 3–12)
NEUTROS PCT: 62 % (ref 43–77)
Neutro Abs: 2.9 10*3/uL (ref 1.7–7.7)
Platelets: 123 10*3/uL — ABNORMAL LOW (ref 150–400)
RBC: 4.02 MIL/uL (ref 3.87–5.11)
RDW: 13.8 % (ref 11.5–15.5)
WBC: 4.6 10*3/uL (ref 4.0–10.5)

## 2014-05-19 LAB — COMPREHENSIVE METABOLIC PANEL
ALBUMIN: 3 g/dL — AB (ref 3.5–5.2)
ALK PHOS: 242 U/L — AB (ref 39–117)
ALT: 637 U/L — AB (ref 0–35)
AST: 401 U/L — ABNORMAL HIGH (ref 0–37)
Anion gap: 11 (ref 5–15)
BUN: 15 mg/dL (ref 6–23)
CALCIUM: 9.3 mg/dL (ref 8.4–10.5)
CO2: 29 mEq/L (ref 19–32)
Chloride: 102 mEq/L (ref 96–112)
Creatinine, Ser: 0.8 mg/dL (ref 0.50–1.10)
GFR calc Af Amer: 75 mL/min — ABNORMAL LOW (ref >90)
GFR calc non Af Amer: 65 mL/min — ABNORMAL LOW (ref >90)
Glucose, Bld: 129 mg/dL — ABNORMAL HIGH (ref 70–99)
POTASSIUM: 4.4 meq/L (ref 3.7–5.3)
SODIUM: 142 meq/L (ref 137–147)
Total Bilirubin: 0.6 mg/dL (ref 0.3–1.2)
Total Protein: 6 g/dL (ref 6.0–8.3)

## 2014-05-19 LAB — GLUCOSE, CAPILLARY: GLUCOSE-CAPILLARY: 113 mg/dL — AB (ref 70–99)

## 2014-05-19 MED ORDER — METOPROLOL TARTRATE 25 MG PO TABS: 12.5000 mg | ORAL_TABLET | Freq: Two times a day (BID) | ORAL | Status: DC

## 2014-05-19 NOTE — Progress Notes (Signed)
Subjective:    [name] [name] continues to feel great.  She was eating breakfast this morning with no difficulty.  She does say she has some soreness in her midback that she notices when she coughs.  She denies SOB, chest pain, abdominal pain or nausea.  Interval Events: -Vital signs stable. -LFTs were elevated yesterday so kept overnight to recheck. -AST 401, ALT 637 trending down from yesterday.  Alk Phos stable at 242.   Objective:    Vital Signs:   Temp:  [98 F (36.7 C)-98.1 F (36.7 C)] 98.1 F (36.7 C) (09/05 2043) Pulse Rate:  [61-65] 65 (09/05 2043) Resp:  [16] 16 (09/05 2043) BP: (121-128)/(39-49) 128/49 mmHg (09/05 2043) SpO2:  [97 %-98 %] 97 % (09/05 2043) Last BM Date: 05/15/14  24-hour weight change: Weight change:   Intake/Output:   Intake/Output Summary (Last 24 hours) at 05/19/14 0743 Last data filed at 05/18/14 2043  Gross per 24 hour  Intake    600 ml  Output      0 ml  Net    600 ml      Physical Exam: General: Vital signs reviewed and noted. Well-developed, well-nourished, in no acute distress; alert, appropriate and cooperative throughout examination.  Lungs:  Normal respiratory effort. Clear to auscultation BL without crackles or wheezes.  Heart: RRR. S1 and S2 normal without gallop, murmur, or rubs.  Abdomen:  BS normoactive. Soft, nontender, nondistended. No masses or organomegaly.  Extremities: No pretibial edema.     Labs:  Basic Metabolic Panel:  Recent Labs Lab 05/15/14 1815 05/16/14 0654 05/17/14 0434 05/18/14 0945 05/19/14 0405  NA 140 145 144 142 142  K 3.8 4.0 4.9 4.2 4.4  CL 103 107 108 103 102  CO2 26 27 27 28 29   GLUCOSE 210* 92 108* 153* 129*  BUN 22 15 11 11 15   CREATININE 0.81 0.66 0.80 0.87 0.80  CALCIUM 9.1 8.7 8.4 9.1 9.3    Liver Function Tests:  Recent Labs Lab 05/15/14 1815 05/16/14 0654 05/17/14 0434 05/18/14 0945 05/19/14 0405  AST 140* 73* 38* 879* 401*  ALT 83* 78* 53* 866* 637*  ALKPHOS 121*  105 92 241* 242*  BILITOT 0.3 0.4 0.3 0.9 0.6  PROT 6.3 5.7* 5.6* 5.9* 6.0  ALBUMIN 3.3* 3.0* 2.9* 2.9* 3.0*    Recent Labs Lab 05/15/14 1815 05/18/14 0945  LIPASE 67* 48  AMYLASE  --  77   CBC:  Recent Labs Lab 05/15/14 1815 05/19/14 0405  WBC 7.1 4.6  NEUTROABS  --  2.9  HGB 12.1 11.7*  HCT 36.1 35.1*  MCV 85.3 87.3  PLT 158 123*   CBG:  Recent Labs Lab 05/18/14 0621 05/18/14 1140 05/18/14 1631 05/18/14 2145 05/19/14 0643  GLUCAP 104* 124* 134* 131* 113*   Lipase: 48.  Imaging: Dg Ercp Biliary & Pancreatic Ducts  05/17/2014   CLINICAL DATA:  Bile duct stone.  EXAM: ERCP .  TECHNIQUE: Multiple spot images obtained with the fluoroscopic device and submitted for interpretation post-procedure.  COMPARISON:  MR of May 16, 2014.  FINDINGS: Five fluoroscopic images were submitted for review. These images demonstrate a balloon catheter being passed through the common bile duct. No definite filling defect is seen on the final image.  IMPRESSION: See above.  These images were submitted for radiologic interpretation only. Please see the procedural report for the amount of contrast and the fluoroscopy time utilized.   Electronically Signed   By: Dionne Ano.D.  On: 05/17/2014 14:04       Medications:    Infusions:    Scheduled Medications: . aspirin  81 mg Oral Daily  . atorvastatin  10 mg Oral Daily  . calcium-vitamin D  1 tablet Oral BID WC  . cycloSPORINE  1 drop Both Eyes BID  . lisinopril  20 mg Oral Daily   And  . hydrochlorothiazide  12.5 mg Oral Daily  . insulin aspart  0-5 Units Subcutaneous QHS  . insulin aspart  0-9 Units Subcutaneous TID WC  . metoprolol tartrate  12.5 mg Oral BID  . pantoprazole  40 mg Oral Daily    PRN Medications: acetaminophen, acetaminophen, polyvinyl alcohol   Assessment/ Plan:    Principal Problem:   Epigastric pain Active Problems:   DM type 2 (diabetes mellitus, type 2)   HYPERTENSION   GERD    Transaminitis   CKD (chronic kidney disease) stage 2, GFR 60-89 ml/min   Pulmonary nodule   Calculus of bile duct without mention of cholecystitis or obstruction   Malnutrition  #Cholodocolithiasis LFTs elevated after ERCP yesterday, now trending down.  Likely a post-procedure effect that can be followed up as an outpatient. -Will discuss dispo with GI today. -Carb modified diet.  #Pulmonary nodule Noted on MRCP, needs follow-up. -Non-contrast CT as outpatient.  #Type 2 diabetes mellitus  CBGs stable. -Continue SSI sensitive ACHS. -Hold home metformin .  #Hypertension Normotensive. -Continue metoprolol 12.5 mg BID.  #Hyperlipidemia  On statin at home. -Continue home atorvastatin 10 mg daily.   #GERD/hiatal hernia Stable. -Continue PPI.   DVT PPX - heparin  CODE STATUS - DNR.  CONSULTS PLACED - GI.  DISPO - Disposition is deferred at this time, awaiting follow up with GI.  The patient does have a current PCP Tamsen Roers, MD) and does not need an [facility] hospital follow-up appointment after discharge.    Is the [facility] hospital follow-up appointment a one-time only appointment? not applicable.  Does the patient have transportation limitations that hinder transportation to clinic appointments? no   SERVICE NEEDED AT [place]         Y = Yes, Blank = No PT:   OT:   RN:   Equipment:   Other:      Length of Stay: 4 day(s)   Signed: Arman Filter, MD  PGY-1, Internal Medicine Resident Pager: [phone] (7AM-5PM) 05/19/2014, 7:43 AM

## 2014-05-19 NOTE — Discharge Instructions (Signed)
·   Thank you for allowing [place] to be involved in your healthcare while you were hospitalized at [facility].   Please note that there have been changes to your home medications.  --> PLEASE LOOK AT YOUR DISCHARGE MEDICATION LIST FOR DETAILS.   Please call your PCP if you have any questions or concerns, or any difficulty getting any of your medications.  Please return to the ER if you have worsening of your symptoms or new severe symptoms arise.  You need to have your liver function tests rechecked to make sure they are still improving.  Please ask your PCP to check these at your appointment on 05/28/14.  You are scheduled to follow up with you GI doctor in November, but have your PCP call and get you in sooner if your lab tests are not improving.

## 2014-05-20 ENCOUNTER — Encounter (HOSPITAL_COMMUNITY): Payer: Self-pay | Admitting: Gastroenterology

## 2014-05-21 LAB — GLUCOSE, CAPILLARY: Glucose-Capillary: 170 mg/dL — ABNORMAL HIGH (ref 70–99)

## 2014-07-08 ENCOUNTER — Other Ambulatory Visit (HOSPITAL_COMMUNITY): Payer: Self-pay | Admitting: Internal Medicine

## 2014-07-15 ENCOUNTER — Other Ambulatory Visit: Payer: Self-pay | Admitting: Internal Medicine

## 2014-07-17 ENCOUNTER — Ambulatory Visit: Payer: Medicare Other | Admitting: Gastroenterology

## 2014-08-02 ENCOUNTER — Encounter: Payer: Self-pay | Admitting: Gastroenterology

## 2014-08-02 ENCOUNTER — Ambulatory Visit (INDEPENDENT_AMBULATORY_CARE_PROVIDER_SITE_OTHER): Payer: Medicare Other | Admitting: Gastroenterology

## 2014-08-02 ENCOUNTER — Other Ambulatory Visit (INDEPENDENT_AMBULATORY_CARE_PROVIDER_SITE_OTHER): Payer: Medicare Other

## 2014-08-02 VITALS — BP 106/50 | HR 64 | Ht 62.25 in | Wt 144.5 lb

## 2014-08-02 DIAGNOSIS — K8050 Calculus of bile duct without cholangitis or cholecystitis without obstruction: Secondary | ICD-10-CM

## 2014-08-02 LAB — COMPREHENSIVE METABOLIC PANEL
ALT: 21 U/L (ref 0–35)
AST: 22 U/L (ref 0–37)
Albumin: 3.4 g/dL — ABNORMAL LOW (ref 3.5–5.2)
Alkaline Phosphatase: 69 U/L (ref 39–117)
BUN: 18 mg/dL (ref 6–23)
CALCIUM: 9 mg/dL (ref 8.4–10.5)
CO2: 30 meq/L (ref 19–32)
Chloride: 104 mEq/L (ref 96–112)
Creatinine, Ser: 0.9 mg/dL (ref 0.4–1.2)
GFR: 66.44 mL/min (ref >60.00)
Glucose, Bld: 158 mg/dL — ABNORMAL HIGH (ref 70–99)
POTASSIUM: 3.8 meq/L (ref 3.5–5.1)
Sodium: 141 mEq/L (ref 135–145)
Total Bilirubin: 0.6 mg/dL (ref 0.2–1.2)
Total Protein: 6.2 g/dL (ref 6.0–8.3)

## 2014-08-02 MED ORDER — URSODIOL 300 MG PO CAPS: 300.0000 mg | ORAL_CAPSULE | Freq: Two times a day (BID) | ORAL | Status: DC

## 2014-08-02 NOTE — Progress Notes (Signed)
Review of pertinent gastrointestinal problems: 1. 05/2014 Choledocholithiasis with transaminitis (presented with epigastric pain)  MRCP 9/3 ? Small CBD stone and then ERCP on 9/4 with extraction of stone by Dr. Deatra Ina. Elevated AST, ALT, alk phos post-procedure, trending down at discharge.   Complex biliary history; Two failed ERCPs by myself and Dr. Carolyne Littles 2008. This was followed by open cholecystectomy by Dr. Mamie Nick. S.Toth with T-tube placement, common duct exploration. Repeat T-tubecholangiogram 1 month after surgery showed retained CBD stones.Underwent combined IR/GI rendezvous procedure, July 2008 thatincluded biliary sphincterotomy and biliary stent placement, as well as gallstone removal.The stent was never removed, assumed to have migrated.   HPI: This is a  very pleasant 80 year old woman whom I last saw 6 or 7 years ago. She was recently hospitalized with recurrent choledocholithiasis treated by ERCP. See above.  She has been fine since hosp stay.  No fever, no chills, no abd pain, she has been eating well.     Past Medical History  Diagnosis Date  . Hypertension   . Diabetes mellitus without complication 4098  . Hiatal hernia   . Skin cancer of face     nose  . Colon polyps   . Gallstones   . GERD (gastroesophageal reflux disease)   . HLD (hyperlipidemia)   . Pancreatitis     Past Surgical History  Procedure Laterality Date  . Cholecystectomy  2008  . Cornea trasplant  2007    at [place] Bilateral ~2010  . Abdominal hysterectomy  1972  . Ercp N/A 05/17/2014    Procedure: ENDOSCOPIC RETROGRADE CHOLANGIOPANCREATOGRAPHY (ERCP);  Surgeon: Inda Castle, MD;  Location: [facility];  Service: Endoscopy;  Laterality: N/A;  . Appendectomy  1972  . Hiatal hernia repair  2006  . Tonsillectomy      Current Outpatient Prescriptions  Medication Sig Dispense Refill  . aspirin 81 MG tablet Take 81 mg by mouth daily.    . calcium citrate-vitamin D  (CITRACAL+D) 315-200 MG-UNIT per tablet Take 1 tablet by mouth 2 (two) times daily.    . cycloSPORINE (RESTASIS) 0.05 % ophthalmic emulsion Place 1 drop into both eyes 2 (two) times daily.    . ergocalciferol (VITAMIN D2) 50000 UNITS capsule Take 50,000 Units by mouth once a week. On saturdays    . lisinopril-hydrochlorothiazide (PRINZIDE,ZESTORETIC) 20-12.5 MG per tablet Take 1 tablet by mouth daily.    . metoprolol tartrate (LOPRESSOR) 25 MG tablet Take 0.5 tablets (12.5 mg total) by mouth 2 (two) times daily. 60 tablet 0  . omega-3 acid ethyl esters (LOVAZA) 1 G capsule Take 1 g by mouth daily.    Marland Kitchen omeprazole (PRILOSEC OTC) 20 MG tablet Take 20 mg by mouth daily.    Vladimir Faster Glycol-Propyl Glycol (SYSTANE) 0.4-0.3 % SOLN Place 1 drop into both eyes 3 (three) times daily as needed (dry eyes).     No current facility-administered medications for this visit.    Allergies as of 08/02/2014  . (No Known Allergies)    Family History  Problem Relation Age of Onset  . Hypertension Mother   . Osteoarthritis Mother   . Colon cancer Sister   . Diabetes Daughter   . Rheum arthritis Son   . Heart disease Mother   . Heart disease Maternal Grandfather   . Heart disease Maternal Grandmother     History   Social History  . Marital Status: Widowed    Spouse Name: N/A    Number of Children: 3  .  Years of Education: N/A   Occupational History  . retired    Social History Main Topics  . Smoking status: Former Smoker -- 0.50 packs/day for 1 years    Types: Cigarettes    Quit date: 05/15/1953  . Smokeless tobacco: Never Used  . Alcohol Use: No  . Drug Use: No  . Sexual Activity: Not on file   Other Topics Concern  . Not on file   Social History Narrative      Physical Exam: Ht 5' 2.25" (1.581 m)  Wt 144 lb 8 oz (65.545 kg)  BMI 26.22 kg/m2 Constitutional: generally well-appearing Psychiatric: alert and oriented x3 Abdomen: soft, nontender, nondistended, no obvious ascites,  no peritoneal signs, normal bowel sounds     Assessment and plan: 80 y.o. female with recurrent choledocholithiasis  We discussed a trial of Actigall which she agreed to, 3 mg twice daily. If this is cost prohibitive then we will simply discontinue it. She understands it may decrease the chance of recurrent stone disease. She has felt very well since her hospital stay. She will have a repeat set of liver tests done today.

## 2014-08-02 NOTE — Patient Instructions (Addendum)
You will have labs checked today in the basement lab.  Please head down after you check out with the front desk (cmet). Start urso (one pill twice daily). Return to see Dr. Ardis Hughs as needed.

## 2014-11-19 ENCOUNTER — Emergency Department (INDEPENDENT_AMBULATORY_CARE_PROVIDER_SITE_OTHER)
Admission: EM | Admit: 2014-11-19 | Discharge: 2014-11-19 | Disposition: A | Discharge status: Home / Self Care | Payer: Medicare Other | Source: Home / Self Care | Attending: Family Medicine | Admitting: Family Medicine

## 2014-11-19 ENCOUNTER — Encounter (HOSPITAL_COMMUNITY): Payer: Self-pay | Admitting: Emergency Medicine

## 2014-11-19 DIAGNOSIS — A084 Viral intestinal infection, unspecified: Secondary | ICD-10-CM | POA: Diagnosis not present

## 2014-11-19 MED ORDER — ONDANSETRON HCL 4 MG PO TABS: 4.0000 mg | ORAL_TABLET | Freq: Three times a day (TID) | ORAL | Status: DC | PRN

## 2014-11-19 NOTE — ED Provider Notes (Signed)
CSN: [id number]     Arrival date & time 11/19/14  1837 History   First MD Initiated Contact with Patient 11/19/14 1947     Chief Complaint  Patient presents with  . Emesis  . Abdominal Pain   (Consider location/radiation/quality/duration/timing/severity/associated sxs/prior Treatment) HPI   Developed abd pain and emesis from 17:00-18:00. Center abdomen. Sharp. Resolved after emesis. Did not take anything for the pain. Deneis fevers, diarreha, CP, SOB, syncope, palpitations, diaphoresis. Denies sick contacts. Started feeling unwell around 16:00. Drank milk just prior to onset of symptoms.  Emesis was food products.    Past Medical History  Diagnosis Date  . Hypertension   . Diabetes mellitus without complication 4098  . Hiatal hernia   . Skin cancer of face     nose  . Colon polyps   . Gallstones   . GERD (gastroesophageal reflux disease)   . HLD (hyperlipidemia)   . Pancreatitis    Past Surgical History  Procedure Laterality Date  . Cholecystectomy  2008  . Cornea trasplant  2007    at [place] tunnel Bilateral ~2010  . Abdominal hysterectomy  1972  . Ercp N/A 05/17/2014    Procedure: ENDOSCOPIC RETROGRADE CHOLANGIOPANCREATOGRAPHY (ERCP);  Surgeon: Inda Castle, MD;  Location: [facility];  Service: Endoscopy;  Laterality: N/A;  . Appendectomy  1972  . Hiatal hernia repair  2006  . Tonsillectomy     Family History  Problem Relation Age of Onset  . Hypertension Mother   . Osteoarthritis Mother   . Colon cancer Sister   . Diabetes Daughter   . Rheum arthritis Son   . Heart disease Mother   . Heart disease Maternal Grandfather   . Heart disease Maternal Grandmother    History  Substance Use Topics  . Smoking status: Former Smoker -- 0.50 packs/day for 1 years    Types: Cigarettes    Quit date: 05/15/1953  . Smokeless tobacco: Never Used  . Alcohol Use: No   OB History    No data available     Review of Systems Per HPI with all other pertinent  systems negative.   Allergies  Review of patient's allergies indicates no known allergies.  Home Medications   Prior to Admission medications   Medication Sig Start Date End Date Taking? Authorizing Provider  aspirin 81 MG tablet Take 81 mg by mouth daily.   Yes Historical Provider, MD  atorvastatin (LIPITOR) 10 MG tablet Take 10 mg by mouth daily.   Yes Historical Provider, MD  lisinopril-hydrochlorothiazide (PRINZIDE,ZESTORETIC) 20-12.5 MG per tablet Take 1 tablet by mouth daily.   Yes Historical Provider, MD  metoprolol tartrate (LOPRESSOR) 25 MG tablet Take 0.5 tablets (12.5 mg total) by mouth 2 (two) times daily. 05/19/14  Yes Langley Gauss Moding, MD  calcium citrate-vitamin D (CITRACAL+D) 315-200 MG-UNIT per tablet Take 1 tablet by mouth 2 (two) times daily.    Historical Provider, MD  cycloSPORINE (RESTASIS) 0.05 % ophthalmic emulsion Place 1 drop into both eyes 2 (two) times daily.    Historical Provider, MD  ergocalciferol (VITAMIN D2) 50000 UNITS capsule Take 50,000 Units by mouth once a week. On saturdays    Historical Provider, MD  omega-3 acid ethyl esters (LOVAZA) 1 G capsule Take 1 g by mouth daily.    Historical Provider, MD  omeprazole (PRILOSEC OTC) 20 MG tablet Take 20 mg by mouth daily.    Historical Provider, MD  ondansetron (ZOFRAN) 4 MG tablet Take 1 tablet (4 mg  total) by mouth every 8 (eight) hours as needed for nausea or vomiting. 11/19/14   Waldemar Dickens, MD  Polyethyl Glycol-Propyl Glycol (SYSTANE) 0.4-0.3 % SOLN Place 1 drop into both eyes 3 (three) times daily as needed (dry eyes).    Historical Provider, MD  ursodiol (ACTIGALL) 300 MG capsule Take 1 capsule (300 mg total) by mouth 2 (two) times daily. 08/02/14   Milus Banister, MD   BP 137/63 mmHg  Pulse 66  Temp(Src) 97.9 F (36.6 C) (Oral)  Resp 18  SpO2 97% Physical Exam  Constitutional: She is oriented to person, place, and time. She appears well-developed and well-nourished.  HENT:  Head: Normocephalic  and atraumatic.  Eyes: EOM are normal. Pupils are equal, round, and reactive to light.  Neck: Normal range of motion.  Cardiovascular: Normal rate.   Pulmonary/Chest: Effort normal and breath sounds normal.  Abdominal: Soft. Bowel sounds are normal. She exhibits no distension and no mass. There is no tenderness. There is no rebound and no guarding.  Neurological: She is alert and oriented to person, place, and time.  Skin: Skin is warm. No rash noted.  Psychiatric: Her behavior is normal. Judgment normal.    ED Course  Procedures (including critical care time) Labs Review Labs Reviewed - No data to display  Imaging Review No results found.   MDM   1. Viral gastroenteritis     likely patient  experiencing an episode of viral gastroenteritis enteritis versus simple upset stomach from something she ate. No overt signs of pancreatitis at this time. Patient with history of cholecystectomy and appendectomy. Patient to maintain a bland diet for 1-2 days. Zofran provided in case patient has return of nausea. Patient to seek emergent medical attention if symptoms return or become worse.  Precautions given and all questions answered  Linna Darner, MD Family Medicine 11/19/2014, 8:24 PM    Waldemar Dickens, MD 11/19/14 2025

## 2014-11-19 NOTE — Discharge Instructions (Signed)
You likely have a stomach infection called viral gastroenteritis. This can take anywhere  From 1-14 days to resolve. Please use the Zofran as needed for nausea. Please go to the emergency room if your pain becomes significantly worse after meals as you have a history of pancreatitis. Please make sure to eat a more bland diet over the next 1-2 days.

## 2014-11-19 NOTE — ED Notes (Signed)
C/o vomiting and upper abdominal pain.  Acute on set around 4 p.m today.  Pt states the she vomited continuously for the past two hours.  Last episode was at 6 p.m.   Denies diarrhea and any changes in diet.

## 2015-05-05 ENCOUNTER — Other Ambulatory Visit: Payer: Self-pay

## 2015-05-05 DIAGNOSIS — Z1231 Encounter for screening mammogram for malignant neoplasm of breast: Secondary | ICD-10-CM

## 2015-05-14 ENCOUNTER — Ambulatory Visit
Admission: RE | Admit: 2015-05-14 | Discharge: 2015-05-14 | Disposition: A | Discharge status: Home / Self Care | Payer: Medicare Other | Source: Ambulatory Visit

## 2015-05-14 DIAGNOSIS — Z1231 Encounter for screening mammogram for malignant neoplasm of breast: Secondary | ICD-10-CM

## 2015-11-12 DIAGNOSIS — Z136 Encounter for screening for cardiovascular disorders: Secondary | ICD-10-CM

## 2015-11-12 NOTE — Congregational Nurse Program (Signed)
Congregational Nurse Program Note  Date of Encounter: 11/12/2015  Past Medical History: Past Medical History  Diagnosis Date  . Hypertension   . Diabetes mellitus without complication [id number]  . Hiatal hernia   . Skin cancer of face     nose  . Colon polyps   . Gallstones   . GERD (gastroesophageal reflux disease)   . HLD (hyperlipidemia)   . Pancreatitis     Encounter Details:     CNP Questionnaire - 10/28/15 1153    Patient Demographics   Is this a new or existing patient? Existing   Patient is considered a/an Not Applicable   Race Caucasian/White   Patient Assistance   Location of Patient Assistance Not Applicable   Patient's financial/insurance status Medicare   Uninsured Patient No   Patient referred to apply for the following financial assistance Not Applicable   Transportation assistance No   Assistance securing medications No   Educational health offerings Cardiac disease   Encounter Details   Primary purpose of visit Chronic Illness/Condition Visit   Was an Emergency Department visit averted? Not Applicable   Does patient have a medical provider? Yes   Patient referred to Not Applicable   Was a mental health screening completed? (GAINS tool) No   Does patient have dental issues? No   Does patient have vision issues? No   Since previous encounter, have you referred patient for abnormal blood pressure that resulted in a new diagnosis or medication change? No   Since previous encounter, have you referred patient for abnormal blood glucose that resulted in a new diagnosis or medication change? No   For Abstraction Use Only   Does patient have insurance? No       Ms. [name] [name], came to my office and requested to have her blood pressure checked since it has not been check in a few weeks. BP 122/68 mmHg . [place], [place]

## 2016-05-21 ENCOUNTER — Other Ambulatory Visit: Payer: Self-pay | Admitting: Family Medicine

## 2016-05-21 DIAGNOSIS — Z1231 Encounter for screening mammogram for malignant neoplasm of breast: Secondary | ICD-10-CM

## 2016-05-31 ENCOUNTER — Ambulatory Visit
Admission: RE | Admit: 2016-05-31 | Discharge: 2016-05-31 | Disposition: A | Discharge status: Home / Self Care | Payer: Medicare Other | Source: Ambulatory Visit | Attending: Family Medicine | Admitting: Family Medicine

## 2016-05-31 DIAGNOSIS — Z1231 Encounter for screening mammogram for malignant neoplasm of breast: Secondary | ICD-10-CM

## 2017-01-27 ENCOUNTER — Other Ambulatory Visit: Payer: Self-pay | Admitting: Family Medicine

## 2017-01-27 DIAGNOSIS — N63 Unspecified lump in unspecified breast: Secondary | ICD-10-CM

## 2017-02-02 ENCOUNTER — Ambulatory Visit
Admission: RE | Admit: 2017-02-02 | Discharge: 2017-02-02 | Disposition: A | Discharge status: Home / Self Care | Payer: Medicare Other | Source: Ambulatory Visit | Attending: Family Medicine | Admitting: Family Medicine

## 2017-02-02 DIAGNOSIS — N63 Unspecified lump in unspecified breast: Secondary | ICD-10-CM

## 2017-02-16 ENCOUNTER — Other Ambulatory Visit: Payer: Self-pay | Admitting: Family Medicine

## 2017-02-16 ENCOUNTER — Ambulatory Visit
Admission: RE | Admit: 2017-02-16 | Discharge: 2017-02-16 | Disposition: A | Discharge status: Home / Self Care | Payer: Medicare Other | Source: Ambulatory Visit | Attending: Family Medicine | Admitting: Family Medicine

## 2017-02-16 DIAGNOSIS — N6320 Unspecified lump in the left breast, unspecified quadrant: Secondary | ICD-10-CM

## 2017-04-29 ENCOUNTER — Other Ambulatory Visit: Payer: Self-pay | Admitting: Family Medicine

## 2017-04-29 DIAGNOSIS — Z1231 Encounter for screening mammogram for malignant neoplasm of breast: Secondary | ICD-10-CM

## 2017-06-02 ENCOUNTER — Ambulatory Visit
Admission: RE | Admit: 2017-06-02 | Discharge: 2017-06-02 | Disposition: A | Discharge status: Home / Self Care | Payer: Medicare Other | Source: Ambulatory Visit | Attending: Family Medicine | Admitting: Family Medicine

## 2017-06-02 DIAGNOSIS — Z1231 Encounter for screening mammogram for malignant neoplasm of breast: Secondary | ICD-10-CM

## 2017-07-14 ENCOUNTER — Other Ambulatory Visit: Payer: Self-pay | Admitting: Orthopedic Surgery

## 2017-07-14 ENCOUNTER — Ambulatory Visit
Admission: RE | Admit: 2017-07-14 | Discharge: 2017-07-14 | Disposition: A | Discharge status: Home / Self Care | Payer: Medicare Other | Source: Ambulatory Visit | Attending: Orthopedic Surgery | Admitting: Orthopedic Surgery

## 2017-07-14 DIAGNOSIS — M545 Low back pain: Secondary | ICD-10-CM

## 2018-05-23 ENCOUNTER — Other Ambulatory Visit: Payer: Self-pay | Admitting: Family Medicine

## 2018-05-23 DIAGNOSIS — Z1231 Encounter for screening mammogram for malignant neoplasm of breast: Secondary | ICD-10-CM

## 2018-06-22 ENCOUNTER — Ambulatory Visit
Admission: RE | Admit: 2018-06-22 | Discharge: 2018-06-22 | Disposition: A | Discharge status: Home / Self Care | Payer: Medicare Other | Source: Ambulatory Visit | Attending: Family Medicine | Admitting: Family Medicine

## 2018-06-22 DIAGNOSIS — Z1231 Encounter for screening mammogram for malignant neoplasm of breast: Secondary | ICD-10-CM

## 2019-09-04 ENCOUNTER — Emergency Department (HOSPITAL_COMMUNITY): Payer: Medicare Other

## 2019-09-04 ENCOUNTER — Inpatient Hospital Stay (HOSPITAL_COMMUNITY): Payer: Medicare Other

## 2019-09-04 ENCOUNTER — Inpatient Hospital Stay (HOSPITAL_COMMUNITY)
Admission: EM | Admit: 2019-09-04 | Discharge: 2019-09-11 | DRG: 061 | Disposition: A | Discharge status: Hospice | Payer: Medicare Other | Attending: Neurology | Admitting: Neurology

## 2019-09-04 ENCOUNTER — Other Ambulatory Visit: Payer: Self-pay

## 2019-09-04 DIAGNOSIS — R1312 Dysphagia, oropharyngeal phase: Secondary | ICD-10-CM | POA: Diagnosis not present

## 2019-09-04 DIAGNOSIS — I272 Pulmonary hypertension, unspecified: Secondary | ICD-10-CM | POA: Diagnosis present

## 2019-09-04 DIAGNOSIS — I110 Hypertensive heart disease with heart failure: Secondary | ICD-10-CM | POA: Diagnosis present

## 2019-09-04 DIAGNOSIS — R0600 Dyspnea, unspecified: Secondary | ICD-10-CM

## 2019-09-04 DIAGNOSIS — I69320 Aphasia following cerebral infarction: Secondary | ICD-10-CM

## 2019-09-04 DIAGNOSIS — I63032 Cerebral infarction due to thrombosis of left carotid artery: Secondary | ICD-10-CM | POA: Diagnosis not present

## 2019-09-04 DIAGNOSIS — R1310 Dysphagia, unspecified: Secondary | ICD-10-CM

## 2019-09-04 DIAGNOSIS — Z6825 Body mass index (BMI) 25.0-25.9, adult: Secondary | ICD-10-CM

## 2019-09-04 DIAGNOSIS — E119 Type 2 diabetes mellitus without complications: Secondary | ICD-10-CM | POA: Diagnosis present

## 2019-09-04 DIAGNOSIS — Z85828 Personal history of other malignant neoplasm of skin: Secondary | ICD-10-CM

## 2019-09-04 DIAGNOSIS — R29810 Facial weakness: Secondary | ICD-10-CM | POA: Diagnosis present

## 2019-09-04 DIAGNOSIS — Z833 Family history of diabetes mellitus: Secondary | ICD-10-CM

## 2019-09-04 DIAGNOSIS — I4821 Permanent atrial fibrillation: Secondary | ICD-10-CM | POA: Diagnosis not present

## 2019-09-04 DIAGNOSIS — Z515 Encounter for palliative care: Secondary | ICD-10-CM

## 2019-09-04 DIAGNOSIS — Z4659 Encounter for fitting and adjustment of other gastrointestinal appliance and device: Secondary | ICD-10-CM | POA: Diagnosis not present

## 2019-09-04 DIAGNOSIS — E1165 Type 2 diabetes mellitus with hyperglycemia: Secondary | ICD-10-CM | POA: Diagnosis not present

## 2019-09-04 DIAGNOSIS — E46 Unspecified protein-calorie malnutrition: Secondary | ICD-10-CM | POA: Diagnosis present

## 2019-09-04 DIAGNOSIS — I4819 Other persistent atrial fibrillation: Secondary | ICD-10-CM | POA: Diagnosis present

## 2019-09-04 DIAGNOSIS — E785 Hyperlipidemia, unspecified: Secondary | ICD-10-CM | POA: Diagnosis present

## 2019-09-04 DIAGNOSIS — Z20828 Contact with and (suspected) exposure to other viral communicable diseases: Secondary | ICD-10-CM | POA: Diagnosis present

## 2019-09-04 DIAGNOSIS — I639 Cerebral infarction, unspecified: Secondary | ICD-10-CM

## 2019-09-04 DIAGNOSIS — R4701 Aphasia: Secondary | ICD-10-CM | POA: Diagnosis present

## 2019-09-04 DIAGNOSIS — I6300 Cerebral infarction due to thrombosis of unspecified precerebral artery: Secondary | ICD-10-CM | POA: Diagnosis not present

## 2019-09-04 DIAGNOSIS — Z7982 Long term (current) use of aspirin: Secondary | ICD-10-CM

## 2019-09-04 DIAGNOSIS — R29726 NIHSS score 26: Secondary | ICD-10-CM | POA: Diagnosis present

## 2019-09-04 DIAGNOSIS — R29727 NIHSS score 27: Secondary | ICD-10-CM | POA: Diagnosis not present

## 2019-09-04 DIAGNOSIS — Z79899 Other long term (current) drug therapy: Secondary | ICD-10-CM

## 2019-09-04 DIAGNOSIS — R471 Dysarthria and anarthria: Secondary | ICD-10-CM | POA: Diagnosis present

## 2019-09-04 DIAGNOSIS — I341 Nonrheumatic mitral (valve) prolapse: Secondary | ICD-10-CM | POA: Diagnosis not present

## 2019-09-04 DIAGNOSIS — I610 Nontraumatic intracerebral hemorrhage in hemisphere, subcortical: Secondary | ICD-10-CM | POA: Diagnosis present

## 2019-09-04 DIAGNOSIS — R32 Unspecified urinary incontinence: Secondary | ICD-10-CM | POA: Diagnosis present

## 2019-09-04 DIAGNOSIS — I480 Paroxysmal atrial fibrillation: Secondary | ICD-10-CM | POA: Diagnosis not present

## 2019-09-04 DIAGNOSIS — I088 Other rheumatic multiple valve diseases: Secondary | ICD-10-CM | POA: Diagnosis present

## 2019-09-04 DIAGNOSIS — Z66 Do not resuscitate: Secondary | ICD-10-CM | POA: Diagnosis present

## 2019-09-04 DIAGNOSIS — I5031 Acute diastolic (congestive) heart failure: Secondary | ICD-10-CM | POA: Diagnosis present

## 2019-09-04 DIAGNOSIS — I361 Nonrheumatic tricuspid (valve) insufficiency: Secondary | ICD-10-CM | POA: Diagnosis not present

## 2019-09-04 DIAGNOSIS — R29722 NIHSS score 22: Secondary | ICD-10-CM | POA: Diagnosis not present

## 2019-09-04 DIAGNOSIS — E7800 Pure hypercholesterolemia, unspecified: Secondary | ICD-10-CM | POA: Diagnosis not present

## 2019-09-04 DIAGNOSIS — I63412 Cerebral infarction due to embolism of left middle cerebral artery: Secondary | ICD-10-CM | POA: Diagnosis present

## 2019-09-04 DIAGNOSIS — G8191 Hemiplegia, unspecified affecting right dominant side: Secondary | ICD-10-CM | POA: Diagnosis present

## 2019-09-04 DIAGNOSIS — Z90710 Acquired absence of both cervix and uterus: Secondary | ICD-10-CM

## 2019-09-04 DIAGNOSIS — I10 Essential (primary) hypertension: Secondary | ICD-10-CM | POA: Diagnosis not present

## 2019-09-04 DIAGNOSIS — R000 Tachycardia, unspecified: Secondary | ICD-10-CM | POA: Diagnosis not present

## 2019-09-04 DIAGNOSIS — Z8249 Family history of ischemic heart disease and other diseases of the circulatory system: Secondary | ICD-10-CM

## 2019-09-04 DIAGNOSIS — Z9049 Acquired absence of other specified parts of digestive tract: Secondary | ICD-10-CM

## 2019-09-04 DIAGNOSIS — I4891 Unspecified atrial fibrillation: Secondary | ICD-10-CM | POA: Diagnosis present

## 2019-09-04 DIAGNOSIS — Z86010 Personal history of colonic polyps: Secondary | ICD-10-CM

## 2019-09-04 DIAGNOSIS — I6340 Cerebral infarction due to embolism of unspecified cerebral artery: Secondary | ICD-10-CM | POA: Diagnosis not present

## 2019-09-04 DIAGNOSIS — K219 Gastro-esophageal reflux disease without esophagitis: Secondary | ICD-10-CM | POA: Diagnosis present

## 2019-09-04 DIAGNOSIS — Z87891 Personal history of nicotine dependence: Secondary | ICD-10-CM

## 2019-09-04 HISTORY — DX: Other ill-defined heart diseases: I51.89

## 2019-09-04 HISTORY — DX: Other persistent atrial fibrillation: I48.19

## 2019-09-04 HISTORY — DX: Cerebral infarction, unspecified: I63.9

## 2019-09-04 LAB — I-STAT CHEM 8, ED
BUN: 14 mg/dL (ref 8–23)
Calcium, Ion: 1.21 mmol/L (ref 1.15–1.40)
Chloride: 103 mmol/L (ref 98–111)
Creatinine, Ser: 0.7 mg/dL (ref 0.44–1.00)
Glucose, Bld: 126 mg/dL — ABNORMAL HIGH (ref 70–99)
HCT: 32 % — ABNORMAL LOW (ref 36.0–46.0)
Hemoglobin: 10.9 g/dL — ABNORMAL LOW (ref 12.0–15.0)
Potassium: 4 mmol/L (ref 3.5–5.1)
Sodium: 139 mmol/L (ref 135–145)
TCO2: 25 mmol/L (ref 22–32)

## 2019-09-04 LAB — CBC
HCT: 34.6 % — ABNORMAL LOW (ref 36.0–46.0)
Hemoglobin: 11 g/dL — ABNORMAL LOW (ref 12.0–15.0)
MCH: 28.2 pg (ref 26.0–34.0)
MCHC: 31.8 g/dL (ref 30.0–36.0)
MCV: 88.7 fL (ref 80.0–100.0)
Platelets: 112 10*3/uL — ABNORMAL LOW (ref 150–400)
RBC: 3.9 MIL/uL (ref 3.87–5.11)
RDW: 13.7 % (ref 11.5–15.5)
WBC: 8.8 10*3/uL (ref 4.0–10.5)
nRBC: 0 % (ref 0.0–0.2)

## 2019-09-04 LAB — COMPREHENSIVE METABOLIC PANEL
ALT: 57 U/L — ABNORMAL HIGH (ref 0–44)
AST: 33 U/L (ref 15–41)
Albumin: 3.3 g/dL — ABNORMAL LOW (ref 3.5–5.0)
Alkaline Phosphatase: 58 U/L (ref 38–126)
Anion gap: 12 (ref 5–15)
BUN: 13 mg/dL (ref 8–23)
CO2: 21 mmol/L — ABNORMAL LOW (ref 22–32)
Calcium: 8.9 mg/dL (ref 8.9–10.3)
Chloride: 105 mmol/L (ref 98–111)
Creatinine, Ser: 0.76 mg/dL (ref 0.44–1.00)
GFR calc Af Amer: >60 mL/min (ref >60)
GFR calc non Af Amer: >60 mL/min (ref >60)
Glucose, Bld: 129 mg/dL — ABNORMAL HIGH (ref 70–99)
Potassium: 4 mmol/L (ref 3.5–5.1)
Sodium: 138 mmol/L (ref 135–145)
Total Bilirubin: 0.6 mg/dL (ref 0.3–1.2)
Total Protein: 5.8 g/dL — ABNORMAL LOW (ref 6.5–8.1)

## 2019-09-04 LAB — DIFFERENTIAL
Abs Immature Granulocytes: 0.02 10*3/uL (ref 0.00–0.07)
Basophils Absolute: 0 10*3/uL (ref 0.0–0.1)
Basophils Relative: 0 %
Eosinophils Absolute: 0.1 10*3/uL (ref 0.0–0.5)
Eosinophils Relative: 1 %
Immature Granulocytes: 0 %
Lymphocytes Relative: 14 %
Lymphs Abs: 1.2 10*3/uL (ref 0.7–4.0)
Monocytes Absolute: 0.7 10*3/uL (ref 0.1–1.0)
Monocytes Relative: 8 %
Neutro Abs: 6.7 10*3/uL (ref 1.7–7.7)
Neutrophils Relative %: 77 %

## 2019-09-04 LAB — MRSA PCR SCREENING: MRSA by PCR: NEGATIVE

## 2019-09-04 LAB — RESPIRATORY PANEL BY RT PCR (FLU A&B, COVID)
Influenza A by PCR: NEGATIVE
Influenza B by PCR: NEGATIVE
SARS Coronavirus 2 by RT PCR: NEGATIVE

## 2019-09-04 LAB — APTT: aPTT: 24 seconds (ref 24–36)

## 2019-09-04 LAB — PROTIME-INR
INR: 1 (ref 0.8–1.2)
Prothrombin Time: 13.5 seconds (ref 11.4–15.2)

## 2019-09-04 MED ORDER — ACETAMINOPHEN 650 MG RE SUPP
650.0000 mg | RECTAL | Status: DC | PRN
  Administered 2019-09-05: 650 mg via RECTAL
  Filled 2019-09-04: qty 1

## 2019-09-04 MED ORDER — LABETALOL HCL 5 MG/ML IV SOLN
10.0000 mg | INTRAVENOUS | Status: DC | PRN
  Administered 2019-09-05: 22:00:00 10 mg via INTRAVENOUS
  Filled 2019-09-04: qty 4

## 2019-09-04 MED ORDER — SENNOSIDES-DOCUSATE SODIUM 8.6-50 MG PO TABS: 1.0000 | ORAL_TABLET | Freq: Every evening | ORAL | Status: DC | PRN

## 2019-09-04 MED ORDER — CHLORHEXIDINE GLUCONATE CLOTH 2 % EX PADS: 6.0000 | MEDICATED_PAD | Freq: Every day | CUTANEOUS | Status: DC

## 2019-09-04 MED ORDER — POLYETHYL GLYCOL-PROPYL GLYCOL 0.4-0.3 % OP SOLN: 1.0000 [drp] | Freq: Three times a day (TID) | OPHTHALMIC | Status: DC | PRN

## 2019-09-04 MED ORDER — SODIUM CHLORIDE 0.9% FLUSH: 3.0000 mL | Freq: Once | INTRAVENOUS | Status: DC

## 2019-09-04 MED ORDER — SODIUM CHLORIDE 0.9 % IV SOLN
50.0000 mL | Freq: Once | INTRAVENOUS | Status: AC
  Administered 2019-09-04: 16:00:00 50 mL via INTRAVENOUS

## 2019-09-04 MED ORDER — ATORVASTATIN CALCIUM 10 MG PO TABS
10.0000 mg | ORAL_TABLET | Freq: Every day | ORAL | Status: DC
  Administered 2019-09-07: 10 mg via ORAL
  Filled 2019-09-04: qty 1

## 2019-09-04 MED ORDER — ONDANSETRON HCL 4 MG/2ML IJ SOLN
INTRAMUSCULAR | Status: AC
  Administered 2019-09-04: 4 mg
  Filled 2019-09-04: qty 2

## 2019-09-04 MED ORDER — ACETAMINOPHEN 160 MG/5ML PO SOLN
650.0000 mg | ORAL | Status: DC | PRN
  Filled 2019-09-04: qty 20.3

## 2019-09-04 MED ORDER — STROKE: EARLY STAGES OF RECOVERY BOOK: Freq: Once | Status: DC

## 2019-09-04 MED ORDER — ORAL CARE MOUTH RINSE
15.0000 mL | Freq: Two times a day (BID) | OROMUCOSAL | Status: DC
  Administered 2019-09-04 – 2019-09-06 (×3): 15 mL via OROMUCOSAL

## 2019-09-04 MED ORDER — PANTOPRAZOLE SODIUM 40 MG PO TBEC: 40.0000 mg | DELAYED_RELEASE_TABLET | Freq: Every day | ORAL | Status: DC

## 2019-09-04 MED ORDER — ACETAMINOPHEN 325 MG PO TABS: 650.0000 mg | ORAL_TABLET | ORAL | Status: DC | PRN

## 2019-09-04 MED ORDER — ALTEPLASE (STROKE) FULL DOSE INFUSION
0.9000 mg/kg | Freq: Once | INTRAVENOUS | Status: AC
  Administered 2019-09-04: 15:00:00 58 mg via INTRAVENOUS
  Filled 2019-09-04: qty 100

## 2019-09-04 MED ORDER — SODIUM CHLORIDE 0.9 % IV SOLN
INTRAVENOUS | Status: DC
  Administered 2019-09-04: 1 mL via INTRAVENOUS

## 2019-09-04 MED ORDER — CYCLOSPORINE 0.05 % OP EMUL
1.0000 [drp] | Freq: Two times a day (BID) | OPHTHALMIC | Status: DC
  Administered 2019-09-05 – 2019-09-11 (×13): 1 [drp] via OPHTHALMIC
  Filled 2019-09-04 (×14): qty 30

## 2019-09-04 MED ORDER — ONDANSETRON HCL 4 MG/2ML IJ SOLN
4.0000 mg | Freq: Once | INTRAMUSCULAR | Status: AC
  Administered 2019-09-04: 4 mg via INTRAVENOUS

## 2019-09-04 MED ORDER — PANTOPRAZOLE SODIUM 40 MG IV SOLR
40.0000 mg | Freq: Every day | INTRAVENOUS | Status: DC
  Administered 2019-09-04 – 2019-09-07 (×4): 40 mg via INTRAVENOUS
  Filled 2019-09-04 (×5): qty 40

## 2019-09-04 MED ORDER — IOHEXOL 350 MG/ML SOLN
100.0000 mL | Freq: Once | INTRAVENOUS | Status: AC | PRN
  Administered 2019-09-04: 100 mL via INTRAVENOUS

## 2019-09-04 NOTE — ED Notes (Signed)
At [place] pt was getting a routine neurocheck. All previous deficits have reemerged. MD notified.

## 2019-09-04 NOTE — H&P (Addendum)
Neurology H&P  CC: Right side weakness, facial droop and aphasia  History is obtained from: EMS and medical record  HPI: [name] [name] is a 85 y.o. female with p[ast medical history of HTN, HLD, DM2, GERD, pancreatitis, who presents to [facility] ED for sudden onset of right side weakness, aphasia and facial droop.  Family was present with her, she was LKW at 36 when she went into the bathroom, and family went to check on her and found her with symptoms. Ct head no acute abnormality She received IV TPA at 1511. NIHSS 23.  CTA head/neck with L MCA oculsion.     LKW: [id number] tpa given?: yes @ 1511  Modified Rankin Scale: 0-Completely asymptomatic and back to baseline post- stroke  ROS: Unable to obtain due to aphasia    Past Medical History:  Diagnosis Date  . Colon polyps   . Diabetes mellitus without complication ([place]) [id number]  . Gallstones   . GERD (gastroesophageal reflux disease)   . Hiatal hernia   . HLD (hyperlipidemia)   . Hypertension   . Pancreatitis   . Skin cancer of face    nose     Family History  Problem Relation Age of Onset  . Hypertension Mother   . Osteoarthritis Mother   . Heart disease Mother   . Colon cancer Sister   . Diabetes Daughter   . Rheum arthritis Son   . Heart disease Maternal Grandfather   . Heart disease Maternal Grandmother     Social History:  reports that she quit smoking about 66 years ago. Her smoking use included cigarettes. She has a 0.50 pack-year smoking history. She has never used smokeless tobacco. She reports that she does not drink alcohol or use drugs.  Exam: Current vital signs: BP (!) 139/94   Pulse 84   Temp 97.9 F (36.6 C) (Oral)   Resp (!) 25   Wt 64.4 kg   SpO2 97%   BMI 25.76 kg/m  Vital signs in last 24 hours: Temp:  [97.9 F (36.6 C)] 97.9 F (36.6 C) (12/22 1544) Pulse Rate:  [79-115] 84 (12/22 1600) Resp:  [18-25] 25 (12/22 1600) BP: (96-149)/(59-95) 139/94 (12/22 1600) SpO2:  [95 %-98 %] 97 % (12/22  1600) Weight:  [64.4 kg] 64.4 kg (12/22 1502)  Physical Exam  Constitutional: Appears well-developed and well-nourished.  Psych: Affect appropriate to situation Eyes: No scleral injection HENT: No OP obstrucion Head: Normocephalic.  Cardiovascular: Normal rate and regular rhythm.  Respiratory: Effort normal and breath sounds normal to anterior ascultation GI: Soft.  No distension. There is no tenderness.  Skin: WDI  Neuro: Mental Status: Patient is awake, alert.  Does not follow commands and nonverbal  Patient is not able to give a clear and coherent history. Cranial Nerves: II: Visual Fields are full. Pupils are equal, round, and reactive to light.   III,IV, VI: gaze to the left  without ptosis or diploplia.  V: unable to assess VII: Facial movement is asymmetric. Right facial droop VIII: hearing is intact to voice X: Uvula elevates symmetrically XI: Shoulder shrug is symmetric. XII: tongue is midline without atrophy or fasciculations.  Motor: Tone is normal. Bulk is normal. 5/5 strength on left arm and left leg, Right arm and right leg flaccid  Sensory: Sensation is symmetric to light touch and temperature in the arms and legs. Deep Tendon Reflexes: Not tested  Plantars: Toes are downgoing bilaterally.  Cerebellar Unable to test   1a Level of Conscious.:  Alert 0 1b LOC Questions: both incorrect 2 1c LOC Commands: both incorrect 2 2 Best Gaze: partial 1 3 Visual: complete 2 4 Facial Palsy: minor 1 5a Motor Arm - left: no drift 0 5b Motor Arm - Right: no movement 4 6a Motor Leg - Left: some effort 3 6b Motor Leg - Right: some effort 3 7 Limb Ataxia: no ataxia 0 8 Sensory: normal 0 9 Best Language: severe 2 10 Dysarthria: severe 2 11 Extinct. and Inatten.: neglect 1 TOTAL:  23  I have reviewed the images obtained  Ct brain no acute abnormality   CTA brain  Left MCA oculssion   Primary Diagnosis:  Cerebral infarction due to embolism of left middle cerebral  artery  Secondary Diagnosis: Essential (primary) hypertension and Type 2 diabetes mellitus w/o complications   Impression:  Acute Left MCA infarct s/p IV TPA   Recommendations: - Admit to ICU  - Maintain SBP <180  - Labetalol 10mg  Q10 min PRN  To maintain SBP - 24 hr CT head s/p IV TPA  - HgbA1c, fasting lipid panel - MRI, MRA  of the brain without contrast - Frequent neuro checks - Frequent NIHSS - NPO until Bedside swallow screen   - Echocardiogram - Carotid dopplers - start Prophylactic therapy-Antiplatelet med: Aspirin - dose 325mg  PO or 300mg  PR if 24 hr head CT is negative for hemorrhage - Risk factor modification - Telemetry monitoring - SCD's  - PT consult, OT consult, Speech consult - Stroke team to follow  Beulah Gandy, DNP, ACNP  I have seen the patient and reviewed the above note. She presented with L MCA syndrome and had an M1 occlusion. Unfortunately, she already has a significant area of infarct on CTP, and given her severely advanced age I do not think that there is a significant chance at return to functional independence after this, and there is a chance of harm from the procedure. I discussed this with Dr. Arizona Constable of [place] and he agrees to not proceed with the intervention, especially given that we could not reach family at the time of that decision.  I did later discuss this with family who expressed understanding. I also discussed what to do in the event of cardiac or respiratopry arrest and have made the patient DNR per the son's wishes, full care up to that point.   Workup as above.   This patient is critically ill and at significant risk of neurological worsening, death and care requires constant monitoring of vital signs, hemodynamics,respiratory and cardiac monitoring, neurological assessment, discussion with family, other specialists and medical decision making of high complexity. I spent 90 minutes of neurocritical care time  in the care of  this patient.  This was time spent independent of any time provided by nurse practitioner or PA.  Roland Rack, MD [facility] [phone]  If 7pm- 7am, please page neurology on call as listed in [place]. 09/04/2019  7:25 PM

## 2019-09-04 NOTE — Progress Notes (Addendum)
Repeat CT obtained for acute deterioration in NIHSS. Preliminary review of images reveals no hemorrhage or other acute abnormality other than dense left MCA sign, which is consistent with the left M1 occlusion seen on CTA performed earlier during the ER course. Will continue to monitor.   AddendumDamaris [name] with Cardiology. Will need consult if she cannot be rate controlled with metoprolol. However, consultation for possible cardioversion is not indicated since she cannot be anticoagulated due to hemorrhagic conversion of her stroke (patients who have been cardioverted must be anticoagulated for 4 weeks following cardioversion).   Electronically signed: Dr. Kerney Elbe

## 2019-09-04 NOTE — ED Notes (Signed)
Patient has no reaction when being pricked on the right side

## 2019-09-04 NOTE — Progress Notes (Signed)
Patient, [name] [name], arrived to 4N31.  Her only possessions upon admission are two hearing aids.

## 2019-09-04 NOTE — Consult Note (Deleted)
Neurology H&P  CC: Right side weakness, facial droop and aphasia  History is obtained from: EMS and medical record  HPI: [name] [name] is a 85 y.o. female with p[ast medical history of HTN, HLD, DM2, GERD, pancreatitis, who presents to [facility] ED for sudden onset of right side weakness, aphasia and facial droop.  Family was present with her, she was LKW at 89 when she went into the bathroom, and family went to check on her and found her with symptoms. Ct head no acute abnormality She received IV TPA at 1511. NIHSS 23.  CTA head/neck with L MCA oculsion.     LKW: [id number] tpa given?: yes @ 1511  Modified Rankin Scale: 0-Completely asymptomatic and back to baseline post- stroke  ROS: Unable to obtain due to aphasia    Past Medical History:  Diagnosis Date  . Colon polyps   . Diabetes mellitus without complication ([place]) [id number]  . Gallstones   . GERD (gastroesophageal reflux disease)   . Hiatal hernia   . HLD (hyperlipidemia)   . Hypertension   . Pancreatitis   . Skin cancer of face    nose     Family History  Problem Relation Age of Onset  . Hypertension Mother   . Osteoarthritis Mother   . Heart disease Mother   . Colon cancer Sister   . Diabetes Daughter   . Rheum arthritis Son   . Heart disease Maternal Grandfather   . Heart disease Maternal Grandmother     Social History:  reports that she quit smoking about 66 years ago. Her smoking use included cigarettes. She has a 0.50 pack-year smoking history. She has never used smokeless tobacco. She reports that she does not drink alcohol or use drugs.  Exam: Current vital signs: BP 135/66   Pulse 80   Resp 18   Wt 64.4 kg   SpO2 95%   BMI 25.76 kg/m  Vital signs in last 24 hours: Pulse Rate:  [80] 80 (12/22 1502) Resp:  [18] 18 (12/22 1502) BP: (135)/(66) 135/66 (12/22 1502) SpO2:  [95 %] 95 % (12/22 1502) Weight:  [64.4 kg] 64.4 kg (12/22 1502)  Physical Exam  Constitutional: Appears well-developed and well-nourished.   Psych: Affect appropriate to situation Eyes: No scleral injection HENT: No OP obstrucion Head: Normocephalic.  Cardiovascular: Normal rate and regular rhythm.  Respiratory: Effort normal and breath sounds normal to anterior ascultation GI: Soft.  No distension. There is no tenderness.  Skin: WDI  Neuro: Mental Status: Patient is awake, alert.  Does not follow commands and nonverbal  Patient is not able to give a clear and coherent history. Cranial Nerves: II: Visual Fields are full. Pupils are equal, round, and reactive to light.   III,IV, VI: gaze to the left  without ptosis or diploplia.  V: unable to assess VII: Facial movement is asymmetric. Right facial droop VIII: hearing is intact to voice X: Uvula elevates symmetrically XI: Shoulder shrug is symmetric. XII: tongue is midline without atrophy or fasciculations.  Motor: Tone is normal. Bulk is normal. 5/5 strength on left arm and left leg, Right arm and right leg flaccid  Sensory: Sensation is symmetric to light touch and temperature in the arms and legs. Deep Tendon Reflexes: Not tested  Plantars: Toes are downgoing bilaterally.  Cerebellar Unable to test   1a Level of Conscious.:  Alert 0 1b LOC Questions: both incorrect 2 1c LOC Commands: both incorrect 2 2 Best Gaze: partial 1 3 Visual: complete 2  4 Facial Palsy: minor 1 5a Motor Arm - left: no drift 0 5b Motor Arm - Right: no movement 4 6a Motor Leg - Left: some effort 3 6b Motor Leg - Right: some effort 3 7 Limb Ataxia: no ataxia 0 8 Sensory: normal 0 9 Best Language: severe 2 10 Dysarthria: severe 2 11 Extinct. and Inatten.: neglect 1 TOTAL:  23  I have reviewed the images obtained  Ct brain no acute abnormality   CTA brain  Left MCA oculssion   Primary Diagnosis:  Cerebral infarction due to embolism of left middle cerebral artery  Secondary Diagnosis: Essential (primary) hypertension and Type 2 diabetes mellitus w/o  complications   Impression:  Acute Left MCA infarct s/p IV TPA   Recommendations: - Admit to ICU  - Maintain SBP <180  - Labetalol 10mg  Q10 min PRN  To maintain SBP - 24 hr CT head s/p IV TPA  - HgbA1c, fasting lipid panel - MRI, MRA  of the brain without contrast - Frequent neuro checks - Frequent NIHSS - NPO until Bedside swallow screen   - Echocardiogram - Carotid dopplers - start Prophylactic therapy-Antiplatelet med: Aspirin - dose 325mg  PO or 300mg  PR if 24 hr head CT is negative for hemorrhage - Risk factor modification - Telemetry monitoring - SCD's  - PT consult, OT consult, Speech consult - Stroke team to follow  Beulah Gandy, DNP, ACNP

## 2019-09-04 NOTE — ED Notes (Signed)
Pt's o2 sats dipping down slightly into the low 90's. Pt placed on 2l comfort o2.

## 2019-09-04 NOTE — ED Provider Notes (Signed)
[facility] EMERGENCY DEPARTMENT Provider Note   CSN: [id number] Arrival date & time: 09/04/19  1449     History Chief Complaint  Patient presents with  . Code Stroke    [name] [name] is a 85 y.o. female.  The history is provided by the patient and medical records. No language interpreter was used.  Cerebrovascular Accident This is a new problem. The current episode started 1 to 2 hours ago. The problem occurs constantly. The problem has not changed since onset.  LVL 5 caveat for Stroke and aphasia     Past Medical History:  Diagnosis Date  . Colon polyps   . Diabetes mellitus without complication ([place]) [id number]  . Gallstones   . GERD (gastroesophageal reflux disease)   . Hiatal hernia   . HLD (hyperlipidemia)   . Hypertension   . Pancreatitis   . Skin cancer of face    nose    Patient Active Problem List   Diagnosis Date Noted  . Malnutrition ([place]) 05/18/2014  . Pulmonary nodule 05/17/2014  . Calculus of bile duct without mention of cholecystitis or obstruction 05/17/2014  . Epigastric pain 05/15/2014  . Transaminitis 05/15/2014  . CKD (chronic kidney disease) stage 2, GFR 60-89 ml/min 05/15/2014  . DM type 2 (diabetes mellitus, type 2) ([place]) 01/03/2008  . HYPERTENSION 01/03/2008  . GERD 01/03/2008  . HIATAL HERNIA 09/28/2005    Past Surgical History:  Procedure Laterality Date  . ABDOMINAL HYSTERECTOMY  1972  . APPENDECTOMY  1972  . BREAST BIOPSY    . carpel tunnel Bilateral ~2010  . CHOLECYSTECTOMY  2008  . cornea trasplant  2007   at [facility]  . ERCP N/A 05/17/2014   Procedure: ENDOSCOPIC RETROGRADE CHOLANGIOPANCREATOGRAPHY (ERCP);  Surgeon: Inda Castle, MD;  Location: [facility];  Service: Endoscopy;  Laterality: N/A;  . HIATAL HERNIA REPAIR  2006  . TONSILLECTOMY       OB History   No obstetric history on file.     Family History  Problem Relation Age of Onset  . Hypertension Mother   . Osteoarthritis Mother   .  Heart disease Mother   . Colon cancer Sister   . Diabetes Daughter   . Rheum arthritis Son   . Heart disease Maternal Grandfather   . Heart disease Maternal Grandmother     Social History   Tobacco Use  . Smoking status: Former Smoker    Packs/day: 0.50    Years: 1.00    Pack years: 0.50    Types: Cigarettes    Quit date: 05/15/1953    Years since quitting: 66.3  . Smokeless tobacco: Never Used  Substance Use Topics  . Alcohol use: No    Alcohol/week: 0.0 standard drinks  . Drug use: No    Home Medications Prior to Admission medications   Medication Sig Start Date End Date Taking? Authorizing Provider  aspirin 81 MG tablet Take 81 mg by mouth daily.    [provider]  atorvastatin (LIPITOR) 10 MG tablet Take 10 mg by mouth daily.    [provider]  calcium citrate-vitamin D (CITRACAL+D) 315-200 MG-UNIT per tablet Take 1 tablet by mouth 2 (two) times daily.    [provider]  cycloSPORINE (RESTASIS) 0.05 % ophthalmic emulsion Place 1 drop into both eyes 2 (two) times daily.    [provider]  ergocalciferol (VITAMIN D2) 50000 UNITS capsule Take 50,000 Units by mouth once a week. On saturdays    [provider]  lisinopril-hydrochlorothiazide (PRINZIDE,ZESTORETIC) 20-12.5 MG per tablet Take 1 tablet by mouth daily.    [provider]  metoprolol tartrate (LOPRESSOR) 25 MG tablet Take 0.5 tablets (12.5 mg total) by mouth 2 (two) times daily. 05/19/14   Moding, Langley Gauss, MD  omega-3 acid ethyl esters (LOVAZA) 1 G capsule Take 1 g by mouth daily.    [provider]  omeprazole (PRILOSEC OTC) 20 MG tablet Take 20 mg by mouth daily.    [provider]  ondansetron (ZOFRAN) 4 MG tablet Take 1 tablet (4 mg total) by mouth every 8 (eight) hours as needed for nausea or vomiting. 11/19/14   Waldemar Dickens, MD  Polyethyl Glycol-Propyl Glycol (SYSTANE) 0.4-0.3 % SOLN Place 1 drop into both eyes 3 (three) times daily as  needed (dry eyes).    [provider]  ursodiol (ACTIGALL) 300 MG capsule Take 1 capsule (300 mg total) by mouth 2 (two) times daily. 08/02/14   Milus Banister, MD    Allergies    Patient has no known allergies.  Review of Systems   Review of Systems  Unable to perform ROS: Patient nonverbal  Neurological: Positive for facial asymmetry, speech difficulty and weakness.    Physical Exam Updated Vital Signs BP 135/66   Pulse 80   Resp 18   Wt 64.4 kg   SpO2 95%   BMI 25.76 kg/m   Physical Exam Vitals and nursing note reviewed.  Constitutional:      Appearance: She is well-developed. She is not diaphoretic.  HENT:     Head: Normocephalic and atraumatic.     Nose: No congestion.     Mouth/Throat:     Mouth: Mucous membranes are moist.  Eyes:     Conjunctiva/sclera: Conjunctivae normal.     Pupils: Pupils are equal, round, and reactive to light.     Comments: L gaze preference  Cardiovascular:     Rate and Rhythm: Normal rate.     Pulses: Normal pulses.     Heart sounds: No murmur.  Pulmonary:     Effort: Pulmonary effort is normal. No respiratory distress.     Breath sounds: Normal breath sounds. No wheezing, rhonchi or rales.  Chest:     Chest wall: No tenderness.  Abdominal:     Palpations: Abdomen is soft.     Tenderness: There is no abdominal tenderness.  Musculoskeletal:        General: No tenderness.  Skin:    General: Skin is warm and dry.  Neurological:     Cranial Nerves: Facial asymmetry present.     Motor: Weakness and abnormal muscle tone present. No seizure activity.     Comments: Left gaze preference, right facial droop, right arm weakness and right leg weakness.  Aphasia.     ED Results / Procedures / Treatments   Labs (all labs ordered are listed, but only abnormal results are displayed) Labs Reviewed  CBC - Abnormal; Notable for the following components:      Result Value   Hemoglobin 11.0 (*)    HCT 34.6 (*)    Platelets 112  (*)    All other components within normal limits  COMPREHENSIVE METABOLIC PANEL - Abnormal; Notable for the following components:   CO2 21 (*)    Glucose, Bld 129 (*)    Total Protein 5.8 (*)    Albumin 3.3 (*)    ALT 57 (*)    All other components within normal limits  I-STAT  CHEM 8, ED - Abnormal; Notable for the following components:   Glucose, Bld 126 (*)    Hemoglobin 10.9 (*)    HCT 32.0 (*)    All other components within normal limits  RESPIRATORY PANEL BY RT PCR (FLU A&B, COVID)  PROTIME-INR  APTT  DIFFERENTIAL  CBG MONITORING, ED    EKG EKG Interpretation  Date/Time:  Tuesday September 04 2019 15:35:01 EST Ventricular Rate:  87 PR Interval:    QRS Duration: 96 QT Interval:  425 QTC Calculation: 512 R Axis:   17 Text Interpretation: Sinus rhythm Atrial premature complex Prolonged QT interval When compared to prior, longer Qtc. No STEMI Confirmed by Antony Blackbird [phone]) on 09/04/2019 3:37:37 PM   Radiology CT HEAD CODE STROKE WO CONTRAST  Result Date: 09/04/2019 CLINICAL DATA:  Code stroke.  Right facial droop EXAM: CT HEAD WITHOUT CONTRAST TECHNIQUE: Contiguous axial images were obtained from the base of the skull through the vertex without intravenous contrast. COMPARISON:  None. FINDINGS: Brain: There is no acute intracranial hemorrhage, mass effect, or edema. Gray-white differentiation remains preserved. Patchy hypoattenuation in the supratentorial white matter is nonspecific but probably reflects mild to moderate chronic microvascular ischemic changes. The there is no extra-axial fluid collection. Prominence of the ventricles and sulci reflects minor generalized parenchymal volume loss. Vascular: There is hyperdensity of the left carotid terminus and MCA. Skull: Unremarkable. Sinuses/Orbits: Minor mucosal thickening Other: Mastoid air cells are clear. ASPECTS [facility] Stroke Program Early CT Score) - Ganglionic level infarction (caudate, lentiform nuclei, internal  capsule, insula, M1-M3 cortex): 7 - Supraganglionic infarction (M4-M6 cortex): 3 Total score (0-10 with 10 being normal): 10 IMPRESSION: 1. No acute intracranial hemorrhage or evidence of acute infarction. Increased density of left carotid terminus and MCA suggesting intraluminal thrombus. 2. ASPECTS is 10 These results were communicated to Dr. Leonel Ramsay at 3:05 pmon 12/22/2020by text page via the [facility] messaging system. Electronically Signed   By: Macy Mis M.D.   On: 09/04/2019 15:08    Procedures Procedures (including critical care time)  CRITICAL CARE Performed by: Gwenyth Allegra Ivylynn Hoppes Total critical care time: 35 minutes Critical care time was exclusive of separately billable procedures and treating other patients. Critical care was necessary to treat or prevent imminent or life-threatening deterioration. Critical care was time spent personally by me on the following activities: development of treatment plan with patient and/or surrogate as well as nursing, discussions with consultants, evaluation of patient's response to treatment, examination of patient, obtaining history from patient or surrogate, ordering and performing treatments and interventions, ordering and review of laboratory studies, ordering and review of radiographic studies, pulse oximetry and re-evaluation of patient's condition.   Medications Ordered in ED Medications  sodium chloride flush (NS) 0.9 % injection 3 mL (3 mLs Intravenous Not Given 09/04/19 1530)  alteplase (ACTIVASE) 1 mg/mL infusion 58 mg (58 mg Intravenous New Bag/Given 09/04/19 1511)    Followed by  0.9 %  sodium chloride infusion (has no administration in time range)  iohexol (OMNIPAQUE) 350 MG/ML injection 100 mL (100 mLs Intravenous Contrast Given 09/04/19 1515)  ondansetron (ZOFRAN) injection 4 mg (4 mg Intravenous Given 09/04/19 1531)  ondansetron (ZOFRAN) 4 MG/2ML injection (4 mg  Given 09/04/19 1520)    ED Course  I have reviewed the  triage vital signs and the nursing notes.  Pertinent labs & imaging results that were available during my care of the patient were reviewed by me and considered in my medical decision making (see chart for details).  MDM Rules/Calculators/A&P                      BEATRIS COFER is a 85 y.o. female with past medical history significant for hypertension, diabetes, GERD, CKD, and gallstones who presents as a code stroke for right-sided weakness with facial droop and aphasia.  According to EMS, patient's last normal was at 1:45 PM today.  Patient went to the bathroom and did not respond with family calling.  The door was knocked down and she was found with right-sided droop and right-sided weakness and inability to talk.  On arrival, patient's airway was cleared and she was taken to the CT scanner.  I evaluated patient after CT with neurology evaluation as well.  She does indeed have right-sided weakness with right facial droop and left gaze preference.  She is not speaking but is able to move her left hand and left foot.  Lungs are clear and chest is nontender.  Abdomen is nontender.  She is having vomiting, will give Zofran.  CT concerning for LVO in the left MCA.  Unfortunately, neurology reports there is a patient currently getting interventional radiology treatment at this time so she will be transferred to [facility] in [facility] for neuro interventional radiology to perform a procedure.  Patient has TPA started.  Rapid Covid was ordered.  Patient given Zofran for nausea and vomiting.  3:28 PM Neurologist updated that she may actually not be transferred.  Awaiting their final recommendations for disposition.  3:39 PM Neurologist made the decision that she will not be transferred will still be admitted here for further management of her acute stroke receiving TPA.  EKG does not show A. fib or a flutter and shows a sinus rhythm with a slightly prolonged QTC.  She already received Zofran for  nausea and vomiting which might threaten her airway.  We will continue to monitor her.  Anticipate admission shortly.    Final Clinical Impression(s) / ED Diagnoses Final diagnoses:  Cerebrovascular accident (CVA), unspecified mechanism ([place])    Clinical Impression: 1. Cerebrovascular accident (CVA), unspecified mechanism ([place])     Disposition: Admit  This note was prepared with assistance of Dragon voice recognition software. Occasional wrong-word or sound-a-like substitutions may have occurred due to the inherent limitations of voice recognition software.      Louretta Tantillo, Gwenyth Allegra, MD 09/05/19 [phone]

## 2019-09-04 NOTE — Code Documentation (Signed)
85 yo female coming from home with complaints of sudden onset of right sided weakness, left gaze, and being unable to talk. Pt was LKW by family at 39 when she was seen going to the bathroom. Pt did not come back, and family went to check on her. They had to break down the door to get to patient. EMS was called and activated a Code Stroke LVO+. Stroke Team met patient at the bridge. Initial NIHSS 23 - Right facial droop, left gaze, right hemianopia, right arm weakness, bilateral leg weakness, and global aphasia. Pt taken to CT. CT negative for hemorrhage. tPA given at 1511. CTA completed and showed left M1. [facility] called for potential transfer to [place]. CTP completed and showed 58 ml core with 123 ml penumbra. Pt deemed not an appropriate candidate for IR. Taken back to ED. Placed on the cardiac monitor. Handoff given to Abbe Amsterdam, Therapist, sports.

## 2019-09-05 ENCOUNTER — Inpatient Hospital Stay (HOSPITAL_COMMUNITY): Payer: Medicare Other

## 2019-09-05 DIAGNOSIS — I63032 Cerebral infarction due to thrombosis of left carotid artery: Secondary | ICD-10-CM

## 2019-09-05 DIAGNOSIS — I341 Nonrheumatic mitral (valve) prolapse: Secondary | ICD-10-CM

## 2019-09-05 DIAGNOSIS — I361 Nonrheumatic tricuspid (valve) insufficiency: Secondary | ICD-10-CM

## 2019-09-05 LAB — HEMOGLOBIN A1C
Hgb A1c MFr Bld: 6.1 % — ABNORMAL HIGH (ref 4.8–5.6)
Mean Plasma Glucose: 128.37 mg/dL

## 2019-09-05 LAB — ECHOCARDIOGRAM COMPLETE: Weight: 2271.62 oz

## 2019-09-05 LAB — LIPID PANEL
Cholesterol: 91 mg/dL (ref 0–200)
HDL: 37 mg/dL — ABNORMAL LOW (ref >40)
LDL Cholesterol: 41 mg/dL (ref 0–99)
Total CHOL/HDL Ratio: 2.5 RATIO
Triglycerides: 67 mg/dL (ref <150)
VLDL: 13 mg/dL (ref 0–40)

## 2019-09-05 LAB — GLUCOSE, CAPILLARY: Glucose-Capillary: 120 mg/dL — ABNORMAL HIGH (ref 70–99)

## 2019-09-05 MED ORDER — CHLORHEXIDINE GLUCONATE CLOTH 2 % EX PADS
6.0000 | MEDICATED_PAD | Freq: Every day | CUTANEOUS | Status: DC
  Administered 2019-09-05 – 2019-09-08 (×4): 6 via TOPICAL

## 2019-09-05 MED ORDER — ORAL CARE MOUTH RINSE
15.0000 mL | Freq: Two times a day (BID) | OROMUCOSAL | Status: DC
  Administered 2019-09-05 – 2019-09-11 (×13): 15 mL via OROMUCOSAL

## 2019-09-05 MED ORDER — ONDANSETRON HCL 4 MG/2ML IJ SOLN: 4.0000 mg | Freq: Four times a day (QID) | INTRAMUSCULAR | Status: DC | PRN

## 2019-09-05 MED ORDER — CHLORHEXIDINE GLUCONATE 0.12 % MT SOLN
15.0000 mL | Freq: Two times a day (BID) | OROMUCOSAL | Status: DC
  Administered 2019-09-05 – 2019-09-11 (×13): 15 mL via OROMUCOSAL
  Filled 2019-09-05 (×12): qty 15

## 2019-09-05 NOTE — Progress Notes (Signed)
PT Cancellation Note  Patient Details Name: CHAMERE OHNESORGE MRN: [id number] DOB: 1926/08/08   Cancelled Treatment:    Reason Eval/Treat Not Completed: Patient at procedure or test/unavailable.  Gone to MRI on arrival.  Will get back as able. 09/05/2019  Ginger Carne., PT Acute Rehabilitation Services [phone]  (pager) [phone]  (office)   Tessie Fass Anahli Arvanitis 09/05/2019, 2:52 PM

## 2019-09-05 NOTE — Evaluation (Signed)
Clinical/Bedside Swallow Evaluation Patient Details  Name: [name] [name] MRN: [id number] Date of Birth: 03-Nov-1925  Today's Date: 09/05/2019 Time: SLP Start Time (ACUTE ONLY): [id number] SLP Stop Time (ACUTE ONLY): 1210 SLP Time Calculation (min) (ACUTE ONLY): 12 min  Past Medical History:  Past Medical History:  Diagnosis Date  . Colon polyps   . Diabetes mellitus without complication ([place]) [id number]  . Gallstones   . GERD (gastroesophageal reflux disease)   . Hiatal hernia   . HLD (hyperlipidemia)   . Hypertension   . Pancreatitis   . Skin cancer of face    nose   Past Surgical History:  Past Surgical History:  Procedure Laterality Date  . ABDOMINAL HYSTERECTOMY  1972  . APPENDECTOMY  1972  . BREAST BIOPSY    . carpel tunnel Bilateral ~2010  . CHOLECYSTECTOMY  2008  . cornea trasplant  2007   at [facility]  . ERCP N/A 05/17/2014   Procedure: ENDOSCOPIC RETROGRADE CHOLANGIOPANCREATOGRAPHY (ERCP);  Surgeon: Inda Castle, MD;  Location: [facility];  Service: Endoscopy;  Laterality: N/A;  . HIATAL HERNIA REPAIR  2006  . TONSILLECTOMY     HPI:  85yo female admitted 09/04/2019 with right weakness, facial droop, aphasia. TPA given. PMH: HTN, HLD, DM2, GERD, pancreatitis. HeadCT - no acute abnormality, CTAhead/neck - LMCA occlusion. MRI pending   Assessment / Plan / Recommendation Clinical Impression  Pt presents with right facial weakness. She is alert, but nonvocal, and does not follow commands. Oral care was completed with suction, which pt tolerated well. Following oral care, pt was given individual ice chips. Minimal oral manipulation and multiple swallows were noted. Pt exhibited strong reflexive cough after the swallow. Recommend continuing with NPO status at this time, due to high risk for aspiration. SLP will continue to follow acutely to assess readiness for po intake, and to assess speech and language as pt able to participate.    SLP Visit Diagnosis: Dysphagia, unspecified  (R13.10)    Aspiration Risk  Severe aspiration risk;Risk for inadequate nutrition/hydration    Diet Recommendation NPO   Medication Administration: Via alternative means    Other  Recommendations Oral Care Recommendations: Oral care QID Other Recommendations: Have oral suction available   Follow up Recommendations (TBD)      Frequency and Duration min 2x/week  2 weeks;1 week       Prognosis Prognosis for Safe Diet Advancement: Fair Barriers to Reach Goals: Language deficits      Swallow Study   General Date of Onset: 09/04/19 HPI: 85yo female admitted 09/04/2019 with right weakness, facial droop, aphasia. TPA given. PMH: HTN, HLD, DM2, GERD, pancreatitis. HeadCT - no acute abnormality, CTAhead/neck - LMCA occlusion. MRI pending Type of Study: Bedside Swallow Evaluation Previous Swallow Assessment: none Diet Prior to this Study: NPO Temperature Spikes Noted: No Respiratory Status: Nasal cannula History of Recent Intubation: No Behavior/Cognition: Alert;Doesn't follow directions;Requires cueing Oral Cavity Assessment: Within Functional Limits Oral Care Completed by SLP: Yes Oral Cavity - Dentition: Adequate natural dentition Self-Feeding Abilities: Total assist Patient Positioning: Upright in bed Baseline Vocal Quality: Aphonic Volitional Cough: Cognitively unable to elicit Volitional Swallow: Unable to elicit    Oral/Motor/Sensory Function Overall Oral Motor/Sensory Function: Moderate impairment Facial ROM: Reduced right Facial Symmetry: Abnormal symmetry right Mandible: Within Functional Limits   Ice Chips Ice chips: Impaired Oral Phase Impairments: Poor awareness of bolus;Impaired mastication Oral Phase Functional Implications: Prolonged oral transit Pharyngeal Phase Impairments: Cough - Immediate;Suspected delayed Swallow;Multiple swallows   Thin Liquid Thin Liquid:  Not tested    Nectar Thick Nectar Thick Liquid: Not tested   Honey Thick Honey Thick Liquid:  Not tested   Puree Puree: Not tested   Solid     Solid: Not tested     [name] [facility], [place] Pathologist Office: [phone] Pager: [phone]  Shonna Chock 09/05/2019,12:19 PM

## 2019-09-05 NOTE — Progress Notes (Addendum)
STROKE TEAM PROGRESS NOTE   HISTORY OF PRESENT ILLNESS (per record) [name] [name] is a 85 y.o. female with p[ast medical history of HTN, HLD, DM2, GERD, pancreatitis, who presents to [facility] ED for sudden onset of right side weakness, aphasia and facial droop. Ct head no acute abnormality. She received IV TPA. NIHSS 23.  CTA head/neck with L MCA oculsion.  Patient was initially considered for intervention but given large CT perfusion core of 58 cubic cc she was not felt to be a good candidate for intervention.   INTERVAL HISTORY Her RN is at the bedside.  She had acute deterioration overnight post IV tPA, stat CTH did not show bleed. She is poorly responsive. She is DNR/DNI. I have personally reviewed history of presenting illness, electronic medical records and imaging films in PACS.  She remains globally aphasic with forced gaze deviation and dense right hemiplegia.  Blood pressure adequately controlled.  No family at the bedside. OBJECTIVE Vitals:   09/05/19 0900 09/05/19 1000 09/05/19 1100 09/05/19 1200  BP: (!) 164/70 (!) 131/53 (!) 146/57 (!) 131/55  Pulse: 97 70 66 71  Resp: 19 19 17 20   Temp:    100.1 F (37.8 C)  TempSrc:    Axillary  SpO2: 100% 100% 100% 100%  Weight:        CBC:  Recent Labs  Lab 09/04/19 1451 09/04/19 1459  WBC 8.8  --   NEUTROABS 6.7  --   HGB 11.0* 10.9*  HCT 34.6* 32.0*  MCV 88.7  --   PLT 112*  --     Basic Metabolic Panel:  Recent Labs  Lab 09/04/19 1451 09/04/19 1459  NA 138 139  K 4.0 4.0  CL 105 103  CO2 21*  --   GLUCOSE 129* 126*  BUN 13 14  CREATININE 0.76 0.70  CALCIUM 8.9  --     Lipid Panel:     Component Value Date/Time   CHOL 91 09/05/2019 0522   TRIG 67 09/05/2019 0522   HDL 37 (L) 09/05/2019 0522   CHOLHDL 2.5 09/05/2019 0522   VLDL 13 09/05/2019 0522   LDLCALC 41 09/05/2019 0522   HgbA1c:  Lab Results  Component Value Date   HGBA1C 6.1 (H) 09/05/2019   Urine Drug Screen: No results found for: LABOPIA,  COCAINSCRNUR, LABBENZ, AMPHETMU, THCU, LABBARB  Alcohol Level No results found for: [facility]  IMAGING   CT Code Stroke CTA Head W/WO contrast  Result Date: 09/04/2019 CLINICAL DATA:  Focal neuro deficit of greater than 6 hours. Stroke suspected. Right-sided facial droop. EXAM: CT ANGIOGRAPHY HEAD AND NECK CT PERFUSION BRAIN TECHNIQUE: Multidetector CT imaging of the head and neck was performed using the standard protocol during bolus administration of intravenous contrast. Multiplanar CT image reconstructions and MIPs were obtained to evaluate the vascular anatomy. Carotid stenosis measurements (when applicable) are obtained utilizing NASCET criteria, using the distal internal carotid diameter as the denominator. Multiphase CT imaging of the brain was performed following IV bolus contrast injection. Subsequent parametric perfusion maps were calculated using RAPID software. CONTRAST:  145mL OMNIPAQUE IOHEXOL 350 MG/ML SOLN COMPARISON:  CT head without contrast 09/04/2019 FINDINGS: CTA NECK FINDINGS Aortic arch: Atherosclerotic calcifications are present at the aortic arch without aneurysm or significant stenosis. Right carotid system: The right common carotid artery is within normal limits. Atherosclerotic changes are noted at the right carotid bifurcation without a significant stenosis relative to the more distal vessel. Mural calcifications are present in the distal cervical right ICA,  just below the skull base. Left carotid system: The left common carotid artery is within normal limits. Left ICA is occluded at the bifurcation without significant reconstitution in the neck. Vertebral arteries: The right vertebral artery is the dominant vessel. Both vertebral arteries originate from the subclavian arteries without significant stenosis. There is no significant stenosis of either vertebral artery in the neck. Skeleton: Degenerative endplate changes are present cervical spine from C2-3 through C6-7. Osseous  foraminal narrowing is greatest at C3-4 and C4-5. No focal lytic or blastic lesions are present. Other neck: The soft tissues the neck are otherwise unremarkable. No focal mucosal or submucosal lesions are present. Salivary glands are within normal limits. Upper chest: Mild dependent atelectasis is present in the right upper lobe. Lung apices are otherwise clear. Thoracic inlet is within normal limits. Review of the MIP images confirms the above findings CTA HEAD FINDINGS Anterior circulation: Atherosclerotic calcifications are present the cavernous right internal carotid artery without a significant stenosis through the ICA terminus. The left internal carotid artery is occluded through the posterior communicating artery. The ICA termini are within normal limits bilaterally. The A1 segments are patent. Anterior communicating artery is patent. Right M1 segment is normal. The left M1 is occluded. Poor distal collaterals are present in the left MCA territory. Right MCA branch vessels demonstrate some distal branch vessel irregularity without a significant proximal stenosis or occlusion. Left-sided collaterals are best posteriorly. Posterior circulation: The right vertebral are artery is dominant above the dural margin. The left V4 segment is hypoplastic compared to the right. PICA origins are visualized and normal. The vertebrobasilar junction is normal. The basilar artery is normal. Both posterior cerebral arteries originate from the basilar tip. Posterior communicating arteries are present bilaterally, left more prominent than right. The PCA branch vessels are within normal limits. Venous sinuses: The dural sinuses are patent. Right transverse sinus is dominant. The straight sinus and deep cerebral veins are intact. Cortical veins are within normal limits. No vascular malformations are present. Anatomic variants: Prominent posterior communicating arteries bilaterally. The left posterior communicating artery feeds the  left ICA in addition to the anterior communicating artery. Review of the MIP images confirms the above findings CT Brain Perfusion Findings: ASPECTS: 10/10 CBF (<30%) Volume: 32mL Perfusion (Tmax>6.0s) volume: 157mL Mismatch Volume: 59mL Infarction Location:Anterior left MCA territory IMPRESSION: 1. Acute anterior left MCA territory infarct. 2. Ischemic penumbra involving the entirety of the posterior portion of the left MCA territory which is partially fed by collaterals. 3. Emergent large vessel occlusion of the distal left M1 segment. 4. 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 5. Atherosclerotic changes at the aortic arch and carotid bifurcations without significant stenosis otherwise. 6. Distal small vessel disease throughout the circle-of-Willis without other significant proximal stenosis, aneurysm, or branch vessel occlusion. 7. Multilevel spondylosis in the cervical spine. These results were called by telephone at the time of interpretation on 09/04/2019 at 3:34 pm to provider MCNEILL [facility] , who verbally acknowledged these results. Electronically Signed   By: San Morelle M.D.   On: 09/04/2019 15:41   CT HEAD WO CONTRAST  Result Date: 09/04/2019 CLINICAL DATA:  Stroke follow-up.  Worsening right-sided weakness EXAM: CT HEAD WITHOUT CONTRAST TECHNIQUE: Contiguous axial images were obtained from the base of the skull through the vertex without intravenous contrast. COMPARISON:  CT head and CTA head 09/04/2019 FINDINGS: Brain: Hypodensity in the left anterior frontal lobe and possibly the left  insula shows mild progression. Subtle findings likely to represent acute infarct. No acute intracranial hemorrhage Generalized atrophy. Chronic microvascular ischemic changes in the white matter. Ventricle size remains normal. Vascular: Hyperdense left MCA has progressed. There is known left internal carotid artery  occlusion. Left M1 is now hyperintense extending into the left M2 branches. Previously the left M1 was patent and did not show hyperdensity on CT. Otherwise no hyperdense vessel. Skull: Negative Sinuses/Orbits: Paranasal sinuses clear. Bilateral cataract surgery. Other: None IMPRESSION: Progression of left MCA hyperdensity compatible with further thrombosis of the left M1 segment extending into the left M2 branches. The left internal carotid artery is occluded on CTA. Possible early signs of infarction in the left frontal white matter and insula. No acute intracranial hemorrhage These results were called by telephone at the time of interpretation on 09/04/2019 at 7:27 pm to provider MCNEILL [facility] , who verbally acknowledged these results. Electronically Signed   By: Franchot Gallo M.D.   On: 09/04/2019 19:28   CT Code Stroke CTA Neck W/WO contrast  Result Date: 09/04/2019 CLINICAL DATA:  Focal neuro deficit of greater than 6 hours. Stroke suspected. Right-sided facial droop. EXAM: CT ANGIOGRAPHY HEAD AND NECK CT PERFUSION BRAIN TECHNIQUE: Multidetector CT imaging of the head and neck was performed using the standard protocol during bolus administration of intravenous contrast. Multiplanar CT image reconstructions and MIPs were obtained to evaluate the vascular anatomy. Carotid stenosis measurements (when applicable) are obtained utilizing NASCET criteria, using the distal internal carotid diameter as the denominator. Multiphase CT imaging of the brain was performed following IV bolus contrast injection. Subsequent parametric perfusion maps were calculated using RAPID software. CONTRAST:  157mL OMNIPAQUE IOHEXOL 350 MG/ML SOLN COMPARISON:  CT head without contrast 09/04/2019 FINDINGS: CTA NECK FINDINGS Aortic arch: Atherosclerotic calcifications are present at the aortic arch without aneurysm or significant stenosis. Right carotid system: The right common carotid artery is within normal limits.  Atherosclerotic changes are noted at the right carotid bifurcation without a significant stenosis relative to the more distal vessel. Mural calcifications are present in the distal cervical right ICA, just below the skull base. Left carotid system: The left common carotid artery is within normal limits. Left ICA is occluded at the bifurcation without significant reconstitution in the neck. Vertebral arteries: The right vertebral artery is the dominant vessel. Both vertebral arteries originate from the subclavian arteries without significant stenosis. There is no significant stenosis of either vertebral artery in the neck. Skeleton: Degenerative endplate changes are present cervical spine from C2-3 through C6-7. Osseous foraminal narrowing is greatest at C3-4 and C4-5. No focal lytic or blastic lesions are present. Other neck: The soft tissues the neck are otherwise unremarkable. No focal mucosal or submucosal lesions are present. Salivary glands are within normal limits. Upper chest: Mild dependent atelectasis is present in the right upper lobe. Lung apices are otherwise clear. Thoracic inlet is within normal limits. Review of the MIP images confirms the above findings CTA HEAD FINDINGS Anterior circulation: Atherosclerotic calcifications are present the cavernous right internal carotid artery without a significant stenosis through the ICA terminus. The left internal carotid artery is occluded through the posterior communicating artery. The ICA termini are within normal limits bilaterally. The A1 segments are patent. Anterior communicating artery is patent. Right M1 segment is normal. The left M1 is occluded. Poor distal collaterals are present in the left MCA territory. Right MCA branch vessels demonstrate some distal branch vessel irregularity without a significant proximal stenosis or occlusion. Left-sided collaterals are  best posteriorly. Posterior circulation: The right vertebral are artery is dominant above  the dural margin. The left V4 segment is hypoplastic compared to the right. PICA origins are visualized and normal. The vertebrobasilar junction is normal. The basilar artery is normal. Both posterior cerebral arteries originate from the basilar tip. Posterior communicating arteries are present bilaterally, left more prominent than right. The PCA branch vessels are within normal limits. Venous sinuses: The dural sinuses are patent. Right transverse sinus is dominant. The straight sinus and deep cerebral veins are intact. Cortical veins are within normal limits. No vascular malformations are present. Anatomic variants: Prominent posterior communicating arteries bilaterally. The left posterior communicating artery feeds the left ICA in addition to the anterior communicating artery. Review of the MIP images confirms the above findings CT Brain Perfusion Findings: ASPECTS: 10/10 CBF (<30%) Volume: 37mL Perfusion (Tmax>6.0s) volume: 137mL Mismatch Volume: 61mL Infarction Location:Anterior left MCA territory IMPRESSION: 1. Acute anterior left MCA territory infarct. 2. Ischemic penumbra involving the entirety of the posterior portion of the left MCA territory which is partially fed by collaterals. 3. Emergent large vessel occlusion of the distal left M1 segment. 4. 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 5. Atherosclerotic changes at the aortic arch and carotid bifurcations without significant stenosis otherwise. 6. Distal small vessel disease throughout the circle-of-Willis without other significant proximal stenosis, aneurysm, or branch vessel occlusion. 7. Multilevel spondylosis in the cervical spine. These results were called by telephone at the time of interpretation on 09/04/2019 at 3:34 pm to provider MCNEILL [facility] , who verbally acknowledged these results. Electronically Signed   By: San Morelle M.D.   On:  09/04/2019 15:41   MR BRAIN WO CONTRAST  Result Date: 09/05/2019 CLINICAL DATA:  Stroke follow-up EXAM: MRI HEAD WITHOUT CONTRAST TECHNIQUE: Multiplanar, multiecho pulse sequences of the brain and surrounding structures were obtained without intravenous contrast. COMPARISON:  CT head 09/04/2019 FINDINGS: Brain: Large territory left MCA acute infarct. Infarct involves left caudate, with partial sparing of left putamen. Extensive infarct throughout the left frontal, temporal, and parietal lobe. Mild amount of hemorrhage in the left caudate. Mild mass-effect and mild midline shift to the right. Mild chronic microvascular ischemic change in the white matter. Chronic microhemorrhage high right parietal lobe and high left frontal lobe. No mass lesion. Negative for hydrocephalus. Vascular: Loss of flow void left internal carotid artery compatible with occlusion. Skull and upper cervical spine: Negative Sinuses/Orbits: Mild mucosal edema paranasal sinuses. Bilateral cataract surgery Other: None IMPRESSION: Large territory left MCA infarct. Mild amount of hemorrhage left putamen Occlusion left internal carotid artery Electronically Signed   By: Franchot Gallo M.D.   On: 09/05/2019 15:12   CT Code Stroke Cerebral Perfusion with contrast  Result Date: 09/04/2019 CLINICAL DATA:  Focal neuro deficit of greater than 6 hours. Stroke suspected. Right-sided facial droop. EXAM: CT ANGIOGRAPHY HEAD AND NECK CT PERFUSION BRAIN TECHNIQUE: Multidetector CT imaging of the head and neck was performed using the standard protocol during bolus administration of intravenous contrast. Multiplanar CT image reconstructions and MIPs were obtained to evaluate the vascular anatomy. Carotid stenosis measurements (when applicable) are obtained utilizing NASCET criteria, using the distal internal carotid diameter as the denominator. Multiphase CT imaging of the brain was performed following IV bolus contrast injection. Subsequent parametric  perfusion maps were calculated using RAPID software. CONTRAST:  166mL OMNIPAQUE IOHEXOL 350 MG/ML SOLN COMPARISON:  CT head without contrast 09/04/2019 FINDINGS: CTA NECK  FINDINGS Aortic arch: Atherosclerotic calcifications are present at the aortic arch without aneurysm or significant stenosis. Right carotid system: The right common carotid artery is within normal limits. Atherosclerotic changes are noted at the right carotid bifurcation without a significant stenosis relative to the more distal vessel. Mural calcifications are present in the distal cervical right ICA, just below the skull base. Left carotid system: The left common carotid artery is within normal limits. Left ICA is occluded at the bifurcation without significant reconstitution in the neck. Vertebral arteries: The right vertebral artery is the dominant vessel. Both vertebral arteries originate from the subclavian arteries without significant stenosis. There is no significant stenosis of either vertebral artery in the neck. Skeleton: Degenerative endplate changes are present cervical spine from C2-3 through C6-7. Osseous foraminal narrowing is greatest at C3-4 and C4-5. No focal lytic or blastic lesions are present. Other neck: The soft tissues the neck are otherwise unremarkable. No focal mucosal or submucosal lesions are present. Salivary glands are within normal limits. Upper chest: Mild dependent atelectasis is present in the right upper lobe. Lung apices are otherwise clear. Thoracic inlet is within normal limits. Review of the MIP images confirms the above findings CTA HEAD FINDINGS Anterior circulation: Atherosclerotic calcifications are present the cavernous right internal carotid artery without a significant stenosis through the ICA terminus. The left internal carotid artery is occluded through the posterior communicating artery. The ICA termini are within normal limits bilaterally. The A1 segments are patent. Anterior communicating artery  is patent. Right M1 segment is normal. The left M1 is occluded. Poor distal collaterals are present in the left MCA territory. Right MCA branch vessels demonstrate some distal branch vessel irregularity without a significant proximal stenosis or occlusion. Left-sided collaterals are best posteriorly. Posterior circulation: The right vertebral are artery is dominant above the dural margin. The left V4 segment is hypoplastic compared to the right. PICA origins are visualized and normal. The vertebrobasilar junction is normal. The basilar artery is normal. Both posterior cerebral arteries originate from the basilar tip. Posterior communicating arteries are present bilaterally, left more prominent than right. The PCA branch vessels are within normal limits. Venous sinuses: The dural sinuses are patent. Right transverse sinus is dominant. The straight sinus and deep cerebral veins are intact. Cortical veins are within normal limits. No vascular malformations are present. Anatomic variants: Prominent posterior communicating arteries bilaterally. The left posterior communicating artery feeds the left ICA in addition to the anterior communicating artery. Review of the MIP images confirms the above findings CT Brain Perfusion Findings: ASPECTS: 10/10 CBF (<30%) Volume: 46mL Perfusion (Tmax>6.0s) volume: 110mL Mismatch Volume: 48mL Infarction Location:Anterior left MCA territory IMPRESSION: 1. Acute anterior left MCA territory infarct. 2. Ischemic penumbra involving the entirety of the posterior portion of the left MCA territory which is partially fed by collaterals. 3. Emergent large vessel occlusion of the distal left M1 segment. 4. 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 5. Atherosclerotic changes at the aortic arch and carotid bifurcations without significant stenosis otherwise. 6. Distal small vessel disease throughout the  circle-of-Willis without other significant proximal stenosis, aneurysm, or branch vessel occlusion. 7. Multilevel spondylosis in the cervical spine. These results were called by telephone at the time of interpretation on 09/04/2019 at 3:34 pm to provider MCNEILL [facility] , who verbally acknowledged these results. Electronically Signed   By: San Morelle M.D.   On: 09/04/2019 15:41   ECHOCARDIOGRAM COMPLETE  Result  Date: 09/05/2019   ECHOCARDIOGRAM REPORT   Patient Name:   [name] [name] Date of Exam: 09/05/2019 Medical Rec #:  [id number]      Height:       62.2 in Accession #:    [id number]     Weight:       142.0 lb Date of Birth:  Jan 22, 1926       BSA:          1.66 m Patient Age:    15 years       BP:           141/66 mmHg Patient Gender: F              HR:           70 bpm. Exam Location:  Inpatient Procedure: 2D Echo, Cardiac Doppler and Color Doppler Indications:    Stroke 434.91  History:        Patient has prior history of Echocardiogram examinations, most                 recent 07/20/2005. Stroke; Risk Factors:Hypertension, Diabetes                 and Former Smoker.  Sonographer:    Paulita Fujita RDCS Referring Phys: [place]  1. Left ventricular ejection fraction, by visual estimation, is 60 to 65%. The left ventricle has normal function. There is no left ventricular hypertrophy.  2. Elevated left atrial pressure.  3. Left ventricular diastolic parameters are consistent with Grade II diastolic dysfunction (pseudonormalization).  4. The left ventricle has no regional wall motion abnormalities.  5. Global right ventricle has normal systolic function.The right ventricular size is normal.  6. Left atrial size was mildly dilated.  7. Right atrial size was mildly dilated.  8. Mild mitral annular calcification.  9. The mitral valve is normal in structure. Mild to moderate mitral valve regurgitation. No evidence of mitral stenosis. 10. The tricuspid valve is normal in  structure. 11. The aortic valve is tricuspid. Aortic valve regurgitation is mild. Mild aortic valve sclerosis without stenosis. 12. The pulmonic valve was normal in structure. Pulmonic valve regurgitation is trivial. 13. Moderately elevated pulmonary artery systolic pressure. 14. The inferior vena cava is normal in size with greater than 50% respiratory variability, suggesting right atrial pressure of 3 mmHg. 15. Normal LV systolic function; grade 2 diastolic dysfunction; mild AI; mild to moderate MR; biatrial enlargement; akinesis of the apical RV free wall; mild TR; moderate pulmonary hypertension. FINDINGS  Left Ventricle: Left ventricular ejection fraction, by visual estimation, is 60 to 65%. The left ventricle has normal function. The left ventricle has no regional wall motion abnormalities. There is no left ventricular hypertrophy. Left ventricular diastolic parameters are consistent with Grade II diastolic dysfunction (pseudonormalization). Elevated left atrial pressure. Right Ventricle: The right ventricular size is normal.Global RV systolic function is has normal systolic function. The tricuspid regurgitant velocity is 3.45 m/s, and with an assumed right atrial pressure of 3 mmHg, the estimated right ventricular systolic pressure is moderately elevated at 50.6 mmHg. Left Atrium: Left atrial size was mildly dilated. Right Atrium: Right atrial size was mildly dilated Pericardium: There is no evidence of pericardial effusion. Mitral Valve: The mitral valve is normal in structure. Mild mitral annular calcification. Mild to moderate mitral valve regurgitation. No evidence of mitral valve stenosis by observation. Tricuspid Valve: The tricuspid valve is normal in structure. Tricuspid valve regurgitation is mild. Aortic Valve: The  aortic valve is tricuspid. Aortic valve regurgitation is mild. Aortic regurgitation PHT measures 459 msec. Mild aortic valve sclerosis is present, with no evidence of aortic valve  stenosis. Pulmonic Valve: The pulmonic valve was normal in structure. Pulmonic valve regurgitation is trivial. Pulmonic regurgitation is trivial. Aorta: The aortic root is normal in size and structure. Venous: The inferior vena cava is normal in size with greater than 50% respiratory variability, suggesting right atrial pressure of 3 mmHg.  Additional Comments: Normal LV systolic function; grade 2 diastolic dysfunction; mild AI; mild to moderate MR; biatrial enlargement; akinesis of the apical RV free wall; mild TR; moderate pulmonary hypertension.  LEFT VENTRICLE PLAX 2D LVIDd:         4.52 cm       Diastology LVIDs:         3.13 cm       LV e' lateral:   7.40 cm/s LV PW:         0.85 cm       LV E/e' lateral: 16.8 LV IVS:        0.83 cm       LV e' medial:    5.77 cm/s LVOT diam:     1.50 cm       LV E/e' medial:  21.5 LV SV:         55 ml LV SV Index:   32.19 LVOT Area:     1.77 cm  LV Volumes (MOD) LV area d, A2C:    25.90 cm LV area d, A4C:    26.70 cm LV area s, A2C:    14.00 cm LV area s, A4C:    15.30 cm LV major d, A2C:   7.39 cm LV major d, A4C:   6.98 cm LV major s, A2C:   5.95 cm LV major s, A4C:   6.18 cm LV vol d, MOD A2C: 77.1 ml LV vol d, MOD A4C: 85.7 ml LV vol s, MOD A2C: 27.8 ml LV vol s, MOD A4C: 32.9 ml LV SV MOD A2C:     49.3 ml LV SV MOD A4C:     85.7 ml LV SV MOD BP:      53.4 ml RIGHT VENTRICLE RV S prime:     10.90 cm/s TAPSE (M-mode): 1.9 cm LEFT ATRIUM             Index       RIGHT ATRIUM           Index LA diam:        3.90 cm 2.35 cm/m  RA Area:     20.90 cm LA Vol (A2C):   41.2 ml 24.86 ml/m RA Volume:   66.20 ml  39.95 ml/m LA Vol (A4C):   42.6 ml 25.71 ml/m LA Biplane Vol: 44.5 ml 26.85 ml/m  AORTIC VALVE LVOT Vmax:   102.00 cm/s LVOT Vmean:  67.800 cm/s LVOT VTI:    0.243 m AI PHT:      459 msec  AORTA Ao Root diam: 2.80 cm MITRAL VALVE                         TRICUSPID VALVE MV Area (PHT): 3.77 cm              TR Peak grad:   47.6 mmHg MV PHT:        58.29 msec             TR Vmax:  345.00 cm/s MV Decel Time: 201 msec MR Peak grad: 127.2 mmHg             SHUNTS MR Mean grad: 91.0 mmHg              Systemic VTI:  0.24 m MR Vmax:      564.00 cm/s            Systemic Diam: 1.50 cm MR Vmean:     453.0 cm/s MV E velocity: 124.00 cm/s 103 cm/s MV A velocity: 60.70 cm/s  70.3 cm/s MV E/A ratio:  2.04        1.5  Kirk Ruths MD Electronically signed by Kirk Ruths MD Signature Date/Time: 09/05/2019/10:20:08 AM    Final    CT HEAD CODE STROKE WO CONTRAST  Result Date: 09/04/2019 CLINICAL DATA:  Code stroke.  Right facial droop EXAM: CT HEAD WITHOUT CONTRAST TECHNIQUE: Contiguous axial images were obtained from the base of the skull through the vertex without intravenous contrast. COMPARISON:  None. FINDINGS: Brain: There is no acute intracranial hemorrhage, mass effect, or edema. Gray-white differentiation remains preserved. Patchy hypoattenuation in the supratentorial white matter is nonspecific but probably reflects mild to moderate chronic microvascular ischemic changes. The there is no extra-axial fluid collection. Prominence of the ventricles and sulci reflects minor generalized parenchymal volume loss. Vascular: There is hyperdensity of the left carotid terminus and MCA. Skull: Unremarkable. Sinuses/Orbits: Minor mucosal thickening Other: Mastoid air cells are clear. ASPECTS [facility] Stroke Program Early CT Score) - Ganglionic level infarction (caudate, lentiform nuclei, internal capsule, insula, M1-M3 cortex): 7 - Supraganglionic infarction (M4-M6 cortex): 3 Total score (0-10 with 10 being normal): 10 IMPRESSION: 1. No acute intracranial hemorrhage or evidence of acute infarction. Increased density of left carotid terminus and MCA suggesting intraluminal thrombus. 2. ASPECTS is 10 These results were communicated to Dr. Leonel Ramsay at 3:05 pmon 12/22/2020by text page via the [facility] messaging system. Electronically Signed   By: Macy Mis M.D.   On: 09/04/2019 15:08    Transthoracic Echocardiogram  00/00/2020 Pending ECG - SR rate  BPM. (See cardiology reading for complete details)  PHYSICAL EXAM Blood pressure (!) 131/55, pulse 71, temperature 100.1 F (37.8 C), temperature source Axillary, resp. rate 20, weight 64.4 kg, SpO2 100 %. Constitutional: Appears frail elderly Caucasian lady in mild distress Psych: unable Eyes: No scleral injection HENT: No OP obstrucion Head: Normocephalic.  Cardiovascular: Normal rate and regular rhythm.  Respiratory: Effort normal and breath sounds normal to anterior ascultation GI: Soft.  No distension. There is no tenderness.  Skin: WDI  Neuro: Mental Status: Patient is did not open eyes during exam, did not attend.  Does not follow commands and nonverbal and globally aphasic. Cranial Nerves: II: Visual Fields w/decreased blink to threat on right. Pupils are equal, round, and reactive to light.   III,IV, VI: gaze forced deviation to the left, did not cross midline.  V: unable to assess VII: Right facial droop VIII: hearing seems intact X: Uvula not able to assess XI: Shoulder shrug not able to assess XII: tongue is midline  Motor: Tone is normal. Bulk is normal. Some spontaneous left side movement, not able to fully participate. Right arm and right leg flaccid  Sensory: Sensation seems symmtric to noxious stimuli; limited testing Deep Tendon Reflexes: Not tested  Plantars: Toes are downgoing bilaterally.  Cerebellar Unable to test   HOME MEDICATIONS:  Medications Prior to Admission  Medication Sig Dispense Refill  . aspirin 81 MG tablet Take 81  mg by mouth daily.    Marland Kitchen atorvastatin (LIPITOR) 10 MG tablet Take 10 mg by mouth daily.    . calcium citrate-vitamin D (CITRACAL+D) 315-200 MG-UNIT per tablet Take 1 tablet by mouth 2 (two) times daily.    . ergocalciferol (VITAMIN D2) 50000 UNITS capsule Take 50,000 Units by mouth once a week.     Marland Kitchen lisinopril (ZESTRIL) 2.5 MG tablet Take 2.5 mg by mouth  daily.    . metFORMIN (GLUCOPHAGE) 500 MG tablet Take 500 mg by mouth 2 (two) times daily with a meal.    . metoprolol tartrate (LOPRESSOR) 25 MG tablet Take 0.5 tablets (12.5 mg total) by mouth 2 (two) times daily. (Patient taking differently: Take 25 mg by mouth daily. ) 60 tablet 0  . Multiple Vitamin (MULTIVITAMIN WITH MINERALS) TABS tablet Take 1 tablet by mouth daily.    Marland Kitchen omega-3 acid ethyl esters (LOVAZA) 1 G capsule Take 1 g by mouth daily.        HOSPITAL MEDICATIONS:  .  stroke: mapping our early stages of recovery book   Does not apply Once  . atorvastatin  10 mg Oral Daily  . chlorhexidine  15 mL Mouth Rinse BID  . Chlorhexidine Gluconate Cloth  6 each Topical Daily  . cycloSPORINE  1 drop Both Eyes BID  . mouth rinse  15 mL Mouth Rinse BID  . mouth rinse  15 mL Mouth Rinse q12n4p  . pantoprazole (PROTONIX) IV  40 mg Intravenous QHS  . sodium chloride flush  3 mL Intravenous Once    ALLERGIES No Known Allergies  ASSESSMENT/PLAN [name] [name] is a 85 y.o. female who presented with L MCA syndrome. She was treated with IV Alteplase, but unfortunately, not a candidate for thrombectomy d/t significant infarct area on CTP.   Stroke: cardieoemboic LMCA infarct. Wk up underway, currently cryptogenic  Resultant  Dense Rt hemiparesis, global aphasia and forced gaze deviation to left  Code Stroke CT Head -    ASPECTS 10  CT head - neg  MRI head- pending  MRA head - n/a  CTA H&N - L MCA oculsion  CT Perfusion- large core  Carotid Doppler - n/a  2D Echo - pending  Sars Corona Virus 2  neg  LDL - 41    Component Value Date/Time   LDLCALC 41 09/05/2019 0522     HgbA1c - 6.1  UDS not done  VTE prophylaxis - SCDs s/p IV tPA Diet  Diet Order            Diet NPO time specified  Diet effective now               81mg  ASA prior to admission, none currently post IV tPA  Patient unable to be counseled  Ongoing aggressive stroke risk factor  management  Therapy recommendations:  pending  Disposition:  Pending  Hypertension  Home BP meds: zestril  Current BP meds: none   Stable . Permissive hypertension (OK if < 180/110) but gradually normalize in 5-7 days . Long-term BP goal normotensive  Hyperlipidemia  Home Lipid lowering medication: lipitor 10mg   LDL 41, goal < 70  Current lipid lowering medication:  none / NPO. Will Consider restarting home med pending [place] and if route avail  Continue statin at discharge pending [place]  Diabetes  Home diabetic meds: glucophage  Current diabetic meds: SSI  HgbA1c 6.1, goal < 7.0  No results for input(s): GLUCAP in the last 72 hours.  Other Stroke  Risk Factors  Advanced age  Other Active Problems  DNR/DNI, consider Palliative care for Elsie moving forward  Hospital day # 1  Desiree Metzger-Cihelka, ARNP-C, ANVP-BC Pager: [phone]  I have personally obtained history,examined this patient, reviewed notes, independently viewed imaging studies, participated in medical decision making and plan of care.ROS completed by me personally and pertinent positives fully documented  I have made any additions or clarifications directly to the above note. Agree with note above.  She presented with global aphasia, follows gaze deviation and dense right hemiplegia due to left MCA infarct from left carotid occlusion.  She received IV TPA but was not considered candidate for mechanical thrombectomy given large core infarct and poor prognosis.  Continue strict blood pressure control and close neurological monitoring as per post TPA protocol.  Check MRI scan of the brain today and repeat CT scan of the head tomorrow morning.  Patient likely going to get worse over the next few days due to development of cytotoxic edema.  I called and left a message for the patient's daughter to call me.  Need need to discuss goals of care with family and perhaps a more palliative care approach would be more  appropriate. This patient is critically ill and at significant risk of neurological worsening, death and care requires constant monitoring of vital signs, hemodynamics,respiratory and cardiac monitoring, extensive review of multiple databases, frequent neurological assessment, discussion with family, other specialists and medical decision making of high complexity.I have made any additions or clarifications directly to the above note.This critical care time does not reflect procedure time, or teaching time or supervisory time of PA/NP/Med Resident etc but could involve care discussion time.  I spent 30 minutes of neurocritical care time  in the care of  this patient.      Antony Contras, MD Medical Director [facility] Stroke Center Pager: [phone] 09/05/2019 4:23 PM  To contact Stroke Continuity provider, please refer to [email or web address] After hours, contact General Neurology

## 2019-09-05 NOTE — Progress Notes (Signed)
  Echocardiogram 2D Echocardiogram has been performed.  [name] [name] 09/05/2019, 8:31 AM

## 2019-09-05 NOTE — Progress Notes (Signed)
OT Cancellation Note  Patient Details Name: [name] [name] MRN: [id number] DOB: [birth year]   Cancelled Treatment:    Reason Eval/Treat Not Completed: Patient not medically ready(s/p TPA until 15:11pm) OT to check back as appropriate to evaluate  Billey Chang, OTR/L  Acute Rehabilitation Services Pager: [phone] Office: [phone] .  09/05/2019, 12:21 PM

## 2019-09-06 ENCOUNTER — Inpatient Hospital Stay (HOSPITAL_COMMUNITY): Payer: Medicare Other

## 2019-09-06 DIAGNOSIS — R1310 Dysphagia, unspecified: Secondary | ICD-10-CM

## 2019-09-06 DIAGNOSIS — I6300 Cerebral infarction due to thrombosis of unspecified precerebral artery: Secondary | ICD-10-CM

## 2019-09-06 DIAGNOSIS — I69320 Aphasia following cerebral infarction: Secondary | ICD-10-CM

## 2019-09-06 LAB — GLUCOSE, CAPILLARY: Glucose-Capillary: 149 mg/dL — ABNORMAL HIGH (ref 70–99)

## 2019-09-06 MED ORDER — JEVITY 1.2 CAL PO LIQD
1000.0000 mL | ORAL | Status: DC
  Filled 2019-09-06: qty 1000

## 2019-09-06 MED ORDER — METOPROLOL TARTRATE 25 MG PO TABS
25.0000 mg | ORAL_TABLET | Freq: Two times a day (BID) | ORAL | Status: DC
  Administered 2019-09-07 – 2019-09-08 (×4): 25 mg
  Filled 2019-09-06 (×4): qty 1

## 2019-09-06 MED ORDER — METOPROLOL TARTRATE 25 MG PO TABS: 25.0000 mg | ORAL_TABLET | Freq: Two times a day (BID) | ORAL | Status: DC

## 2019-09-06 MED ORDER — JEVITY 1.2 CAL PO LIQD
1000.0000 mL | ORAL | Status: DC
  Administered 2019-09-06: 1000 mL
  Filled 2019-09-06 (×3): qty 1000

## 2019-09-06 MED ORDER — METOPROLOL TARTRATE 5 MG/5ML IV SOLN
5.0000 mg | Freq: Once | INTRAVENOUS | Status: AC
  Administered 2019-09-07: 5 mg via INTRAVENOUS
  Filled 2019-09-06: qty 5

## 2019-09-06 MED ORDER — PRO-STAT SUGAR FREE PO LIQD
30.0000 mL | Freq: Every day | ORAL | Status: DC
  Administered 2019-09-07 – 2019-09-08 (×2): 30 mL
  Filled 2019-09-06 (×2): qty 30

## 2019-09-06 NOTE — Progress Notes (Addendum)
Initial Nutrition Assessment RD working remotely.  DOCUMENTATION CODES:   Not applicable   INTERVENTION:    Jevity 1.2 at 25 ml/h, increase by 10 ml every 4 hours to goal rate of 45 ml/h (1080 ml per day)   Pro-stat 30 ml once daily   Provides 1396 kcal, 75 gm protein, 875 ml free water daily  NUTRITION DIAGNOSIS:   Inadequate oral intake related to dysphagia as evidenced by NPO status(NPO except D1-honey snacks with supervision).  GOAL:   Patient will meet greater than or equal to 90% of their needs  MONITOR:   PO intake, TF tolerance, Labs  REASON FOR ASSESSMENT:   Consult Enteral/tube feeding initiation and management  ASSESSMENT:   85 yo female admitted with L MCA occlusion. Received IV TPA.  PMH includes HTN, HLD, DM-2, GERD, pancreatitis.   S/P MBS with SLP today. Patient with moderate-severe dysphagia. SLP recommends dysphagia 1 with honey thick liquids (snacks with supervision only).  Tube feeding protocol has been ordered. RN to place NG tube at bedside for feeding and meds.   Last weight from 08/02/2014 65.5 kg Current weight 64.4 kg  NUTRITION - FOCUSED PHYSICAL EXAM:  unable to complete  Diet Order:   Diet Order            Diet NPO time specified  Diet effective now              EDUCATION NEEDS:   No education needs have been identified at this time  Skin:  Skin Assessment: Reviewed RN Assessment  Last BM:  12/24 type 6  Height:   Ht Readings from Last 1 Encounters:  09/06/19 5' 2.25" (1.581 m)    Weight:   Wt Readings from Last 1 Encounters:  09/04/19 64.4 kg    Ideal Body Weight:  50.6 kg  BMI:  Body mass index is 25.76 kg/m.  Estimated Nutritional Needs:   Kcal:  1350-1550  Protein:  70-80 gm  Fluid:  1.5 L    Molli Barrows, RD, LDN, [place] Pager [phone] After Hours Pager [phone]

## 2019-09-06 NOTE — Progress Notes (Signed)
  Speech Language Pathology Treatment: Dysphagia  Patient Details Name: [name] [name] MRN: [id number] DOB: [birth year] Today's Date: 09/06/2019 Time: [id number] SLP Time Calculation (min) (ACUTE ONLY): 9 min  Assessment / Plan / Recommendation Clinical Impression  Pt seen for ongoing swallowing treatment.  SLP provided oral care prior to administration of PO trials.  Pt tolerated ice chips by spoon.  There was decreased labial seal with ice chips, but pt was able to control bolus and prevent anterior spillage from R side of oral cavity.  There was immediate, prolonged, wet cough with red face and watery eyes with small amount of water by spoon.  Penetration/aspiration suspected to have occurred before the swallow.  With teaspoon amounts of nectar thick liquid there were multiple swallows (4-6) and a single mild cough was noted following cleansing swallows which seemingly cleared any possible penetration.   Recommend instrumental swallow evaluation to determine if pt can initiate a modified oral diet safely, or if temporary alternate means of nutrition needed.  MBSS has been scheduled later this date.   HPI HPI: 85yo female admitted 09/04/2019 with right weakness, facial droop, aphasia. TPA given. PMH: HTN, HLD, DM2, GERD, pancreatitis. CTAhead/neck - LMCA occlusion. MRI 12/23 revealed large L MCA infarct including subcortical structures. "Large territory left MCA acute infarct. Infarct involves left caudate, with partial sparing of left putamen. Extensive infarct throughout the left frontal, temporal, and parietal lobe. Mild amount of hemorrhage in the left caudate. Mild mass-effect and mild midline shift to the right."      SLP Plan  MBS       Recommendations  Diet recommendations: NPO Medication Administration: Via alternative means                Oral Care Recommendations: Oral care QID Follow up Recommendations: (Continue ST at next level of care) SLP Visit Diagnosis: Dysphagia,  unspecified (R13.10) Plan: [place], [place], [place] Office: [phone]  09/06/2019, 9:47 AM

## 2019-09-06 NOTE — Evaluation (Signed)
Physical Therapy Evaluation Patient Details Name: [name] [name] MRN: [id number] DOB: December 26, 1925 Today's Date: 09/06/2019   History of Present Illness  Patient is a 54 y/.o female who presents with right side weakness, aphasia and facial droop. Head CT-unremarkable.CTA head/neck with L MCA occulsion. s/p tPA. Not a candidate for thrombectomy. PMH includes  HTN, HLD, DM2, GERD, pancreatitis.  Clinical Impression  Patient presents with right hemiplegia, increased tone RUE/LE, language deficits, impaired balance, right inattention, left gaze preference and impaired mobility s/p above. Pt with decreased arousal. Able to follow 2 commands inconsistently using multimodal cues. No active movement noted on right side. Not sure of pt's PLOF/baseline as pt not able to state at this time. Would benefit from SNF to maximize independence and decrease burden of care prior to return home. Will follow acutely.     Follow Up Recommendations SNF;Supervision for mobility/OOB;Supervision/Assistance - 24 hour    Equipment Recommendations  Other (comment)(defer to post acute setting)    Recommendations for Other Services       Precautions / Restrictions Precautions Precautions: Fall Restrictions Weight Bearing Restrictions: No      Mobility  Bed Mobility Overal bed mobility: Needs Assistance Bed Mobility: Supine to Sit;Sit to Supine     Supine to sit: Total assist;HOB elevated Sit to supine: Total assist;HOB elevated   General bed mobility comments: Assist to bring LEs to EOB, with trunk and to scoot bottom to EOB. Pt initiating trunk to get back to bed but needed assist with LEs and trunk.  Transfers Overall transfer level: Needs assistance Equipment used: 2 person hand held assist Transfers: Sit to/from Stand Sit to Stand: Total assist;+2 physical assistance;From elevated surface         General transfer comment: Attempted to stand from EOB x2 however no activation of either  LE.  Ambulation/Gait             General Gait Details: Unable  Stairs            Wheelchair Mobility    Modified Rankin (Stroke Patients Only) Modified Rankin (Stroke Patients Only) Pre-Morbid Rankin Score: Moderate disability(unsure) Modified Rankin: Severe disability     Balance Overall balance assessment: Needs assistance Sitting-balance support: Feet supported;No upper extremity supported Sitting balance-Leahy Scale: Zero Sitting balance - Comments: Requires Max A for static sitting balance with heavy left lateral lean. Postural control: Left lateral lean   Standing balance-Leahy Scale: Zero                               Pertinent Vitals/Pain Pain Assessment: Faces Faces Pain Scale: No hurt Pain Descriptors / Indicators: Moaning    Home Living Family/patient expects to be discharged to:: Skilled nursing facility                      Prior Function           Comments: Pt not able to provide PLOF/history due to poor arousal and aphasia.     Hand Dominance        Extremity/Trunk Assessment   Upper Extremity Assessment Upper Extremity Assessment: Defer to OT evaluation;RUE deficits/detail RUE Deficits / Details: flaccidity noted; increased tone.    Lower Extremity Assessment Lower Extremity Assessment: RLE deficits/detail RLE Deficits / Details: Increased tone noted throughout. No active movement.    Cervical / Trunk Assessment Cervical / Trunk Assessment: Kyphotic  Communication   Communication: Expressive difficulties;Receptive difficulties  Cognition  Arousal/Alertness: Lethargic Behavior During Therapy: Flat affect Overall Cognitive Status: Difficult to assess                                 General Comments: Pt very lethargic, left gaze preference. Right inattention. Followed 2 commands inconsistently to "give me your left hand" and "kick out your left leg," using visual and verbal cues. Not  following any other commands.      General Comments General comments (skin integrity, edema, etc.): VSS.    Exercises     Assessment/Plan    PT Assessment Patient needs continued PT services  PT Problem List Decreased strength;Decreased safety awareness;Decreased mobility;Impaired tone;Decreased range of motion;Decreased coordination;Decreased knowledge of precautions;Decreased balance;Decreased activity tolerance;Decreased cognition       PT Treatment Interventions Therapeutic activities;Gait training;Therapeutic exercise;Patient/family education;Wheelchair mobility training;Functional mobility training;Balance training;Neuromuscular re-education;Manual techniques;Cognitive remediation;DME instruction    PT Goals (Current goals can be found in the Care Plan section)  Acute Rehab PT Goals Patient Stated Goal: unable to state PT Goal Formulation: Patient unable to participate in goal setting Time For Goal Achievement: 09/20/19 Potential to Achieve Goals: Fair    Frequency Min 3X/week   Barriers to discharge        Co-evaluation               AM-PAC PT "6 Clicks" Mobility  Outcome Measure Help needed turning from your back to your side while in a flat bed without using bedrails?: Total Help needed moving from lying on your back to sitting on the side of a flat bed without using bedrails?: Total Help needed moving to and from a bed to a chair (including a wheelchair)?: Total Help needed standing up from a chair using your arms (e.g., wheelchair or bedside chair)?: Total Help needed to walk in hospital room?: Total Help needed climbing 3-5 steps with a railing? : Total 6 Click Score: 6    End of Session Equipment Utilized During Treatment: Gait belt Activity Tolerance: Patient limited by lethargy Patient left: in bed;with call bell/phone within reach;with bed alarm set;with SCD's reapplied Nurse Communication: Mobility status;Need for lift equipment PT Visit  Diagnosis: Hemiplegia and hemiparesis Hemiplegia - Right/Left: Right Hemiplegia - caused by: Cerebral infarction    Time: [id number] PT Time Calculation (min) (ACUTE ONLY): 12 min   Charges:   PT Evaluation $PT Eval Moderate Complexity: 1 Mod          Marisa Severin, PT, DPT Acute Rehabilitation Services Pager [phone] Office [phone]      Marguarite Arbour A Tacari Repass 09/06/2019, 12:06 PM

## 2019-09-06 NOTE — Progress Notes (Signed)
STROKE TEAM PROGRESS NOTE   INTERVAL HISTORY Patient remains globally aphasic with dense hemiplegia and left gaze preference.  Vital signs remained stable.  I had a long telephone discussion with the patient's daughter over the phone about her prognosis and plan of care.  She agrees with short-term feeding tube for now but will discuss with rest family members to decide on long-term feeding tube and nursing home placement versus palliative care  OBJECTIVE Vitals:   09/06/19 0500 09/06/19 0600 09/06/19 0700 09/06/19 0800  BP: (!) 168/80 (!) 176/76 (!) 175/82 (!) 175/84  Pulse: 77 78 71 65  Resp: 17 (!) 21 18 17   Temp:    99 F (37.2 C)  TempSrc:    Axillary  SpO2: 100% 100% 100% 98%  Weight:       CBC:  Recent Labs  Lab 09/04/19 1451 09/04/19 1459  WBC 8.8  --   NEUTROABS 6.7  --   HGB 11.0* 10.9*  HCT 34.6* 32.0*  MCV 88.7  --   PLT 112*  --    Basic Metabolic Panel:  Recent Labs  Lab 09/04/19 1451 09/04/19 1459  NA 138 139  K 4.0 4.0  CL 105 103  CO2 21*  --   GLUCOSE 129* 126*  BUN 13 14  CREATININE 0.76 0.70  CALCIUM 8.9  --    Lipid Panel:     Component Value Date/Time   CHOL 91 09/05/2019 0522   TRIG 67 09/05/2019 0522   HDL 37 (L) 09/05/2019 0522   CHOLHDL 2.5 09/05/2019 0522   VLDL 13 09/05/2019 0522   LDLCALC 41 09/05/2019 0522   HgbA1c:  Lab Results  Component Value Date   HGBA1C 6.1 (H) 09/05/2019   IMAGING CT Code Stroke CTA Head W/WO contrast  Result Date: 09/04/2019 CLINICAL DATA:  Focal neuro deficit of greater than 6 hours. Stroke suspected. Right-sided facial droop. EXAM: CT ANGIOGRAPHY HEAD AND NECK CT PERFUSION BRAIN TECHNIQUE: Multidetector CT imaging of the head and neck was performed using the standard protocol during bolus administration of intravenous contrast. Multiplanar CT image reconstructions and MIPs were obtained to evaluate the vascular anatomy. Carotid stenosis measurements (when applicable) are obtained utilizing NASCET  criteria, using the distal internal carotid diameter as the denominator. Multiphase CT imaging of the brain was performed following IV bolus contrast injection. Subsequent parametric perfusion maps were calculated using RAPID software. CONTRAST:  183mL OMNIPAQUE IOHEXOL 350 MG/ML SOLN COMPARISON:  CT head without contrast 09/04/2019 FINDINGS: CTA NECK FINDINGS Aortic arch: Atherosclerotic calcifications are present at the aortic arch without aneurysm or significant stenosis. Right carotid system: The right common carotid artery is within normal limits. Atherosclerotic changes are noted at the right carotid bifurcation without a significant stenosis relative to the more distal vessel. Mural calcifications are present in the distal cervical right ICA, just below the skull base. Left carotid system: The left common carotid artery is within normal limits. Left ICA is occluded at the bifurcation without significant reconstitution in the neck. Vertebral arteries: The right vertebral artery is the dominant vessel. Both vertebral arteries originate from the subclavian arteries without significant stenosis. There is no significant stenosis of either vertebral artery in the neck. Skeleton: Degenerative endplate changes are present cervical spine from C2-3 through C6-7. Osseous foraminal narrowing is greatest at C3-4 and C4-5. No focal lytic or blastic lesions are present. Other neck: The soft tissues the neck are otherwise unremarkable. No focal mucosal or submucosal lesions are present. Salivary glands are within normal  limits. Upper chest: Mild dependent atelectasis is present in the right upper lobe. Lung apices are otherwise clear. Thoracic inlet is within normal limits. Review of the MIP images confirms the above findings CTA HEAD FINDINGS Anterior circulation: Atherosclerotic calcifications are present the cavernous right internal carotid artery without a significant stenosis through the ICA terminus. The left internal  carotid artery is occluded through the posterior communicating artery. The ICA termini are within normal limits bilaterally. The A1 segments are patent. Anterior communicating artery is patent. Right M1 segment is normal. The left M1 is occluded. Poor distal collaterals are present in the left MCA territory. Right MCA branch vessels demonstrate some distal branch vessel irregularity without a significant proximal stenosis or occlusion. Left-sided collaterals are best posteriorly. Posterior circulation: The right vertebral are artery is dominant above the dural margin. The left V4 segment is hypoplastic compared to the right. PICA origins are visualized and normal. The vertebrobasilar junction is normal. The basilar artery is normal. Both posterior cerebral arteries originate from the basilar tip. Posterior communicating arteries are present bilaterally, left more prominent than right. The PCA branch vessels are within normal limits. Venous sinuses: The dural sinuses are patent. Right transverse sinus is dominant. The straight sinus and deep cerebral veins are intact. Cortical veins are within normal limits. No vascular malformations are present. Anatomic variants: Prominent posterior communicating arteries bilaterally. The left posterior communicating artery feeds the left ICA in addition to the anterior communicating artery. Review of the MIP images confirms the above findings CT Brain Perfusion Findings: ASPECTS: 10/10 CBF (<30%) Volume: 56mL Perfusion (Tmax>6.0s) volume: 115mL Mismatch Volume: 75mL Infarction Location:Anterior left MCA territory IMPRESSION: 1. Acute anterior left MCA territory infarct. 2. Ischemic penumbra involving the entirety of the posterior portion of the left MCA territory which is partially fed by collaterals. 3. Emergent large vessel occlusion of the distal left M1 segment. 4. 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 5. Atherosclerotic changes at the aortic arch and carotid bifurcations without significant stenosis otherwise. 6. Distal small vessel disease throughout the circle-of-Willis without other significant proximal stenosis, aneurysm, or branch vessel occlusion. 7. Multilevel spondylosis in the cervical spine. These results were called by telephone at the time of interpretation on 09/04/2019 at 3:34 pm to provider MCNEILL [facility] , who verbally acknowledged these results. Electronically Signed   By: San Morelle M.D.   On: 09/04/2019 15:41   CT HEAD WO CONTRAST  Result Date: 09/04/2019 CLINICAL DATA:  Stroke follow-up.  Worsening right-sided weakness EXAM: CT HEAD WITHOUT CONTRAST TECHNIQUE: Contiguous axial images were obtained from the base of the skull through the vertex without intravenous contrast. COMPARISON:  CT head and CTA head 09/04/2019 FINDINGS: Brain: Hypodensity in the left anterior frontal lobe and possibly the left insula shows mild progression. Subtle findings likely to represent acute infarct. No acute intracranial hemorrhage Generalized atrophy. Chronic microvascular ischemic changes in the white matter. Ventricle size remains normal. Vascular: Hyperdense left MCA has progressed. There is known left internal carotid artery occlusion. Left M1 is now hyperintense extending into the left M2 branches. Previously the left M1 was patent and did not show hyperdensity on CT. Otherwise no hyperdense vessel. Skull: Negative Sinuses/Orbits: Paranasal sinuses clear. Bilateral cataract surgery. Other: None IMPRESSION: Progression of left MCA hyperdensity compatible with further thrombosis of the left M1 segment extending into the left M2 branches. The left internal carotid artery is occluded on CTA. Possible early signs  of infarction in the left frontal white matter and insula. No acute intracranial hemorrhage These results were called by telephone at the time of interpretation  on 09/04/2019 at 7:27 pm to provider MCNEILL [facility] , who verbally acknowledged these results. Electronically Signed   By: Franchot Gallo M.D.   On: 09/04/2019 19:28   CT Code Stroke CTA Neck W/WO contrast  Result Date: 09/04/2019 CLINICAL DATA:  Focal neuro deficit of greater than 6 hours. Stroke suspected. Right-sided facial droop. EXAM: CT ANGIOGRAPHY HEAD AND NECK CT PERFUSION BRAIN TECHNIQUE: Multidetector CT imaging of the head and neck was performed using the standard protocol during bolus administration of intravenous contrast. Multiplanar CT image reconstructions and MIPs were obtained to evaluate the vascular anatomy. Carotid stenosis measurements (when applicable) are obtained utilizing NASCET criteria, using the distal internal carotid diameter as the denominator. Multiphase CT imaging of the brain was performed following IV bolus contrast injection. Subsequent parametric perfusion maps were calculated using RAPID software. CONTRAST:  131mL OMNIPAQUE IOHEXOL 350 MG/ML SOLN COMPARISON:  CT head without contrast 09/04/2019 FINDINGS: CTA NECK FINDINGS Aortic arch: Atherosclerotic calcifications are present at the aortic arch without aneurysm or significant stenosis. Right carotid system: The right common carotid artery is within normal limits. Atherosclerotic changes are noted at the right carotid bifurcation without a significant stenosis relative to the more distal vessel. Mural calcifications are present in the distal cervical right ICA, just below the skull base. Left carotid system: The left common carotid artery is within normal limits. Left ICA is occluded at the bifurcation without significant reconstitution in the neck. Vertebral arteries: The right vertebral artery is the dominant vessel. Both vertebral arteries originate from the subclavian arteries without significant stenosis. There is no significant stenosis of either vertebral artery in the neck. Skeleton: Degenerative endplate  changes are present cervical spine from C2-3 through C6-7. Osseous foraminal narrowing is greatest at C3-4 and C4-5. No focal lytic or blastic lesions are present. Other neck: The soft tissues the neck are otherwise unremarkable. No focal mucosal or submucosal lesions are present. Salivary glands are within normal limits. Upper chest: Mild dependent atelectasis is present in the right upper lobe. Lung apices are otherwise clear. Thoracic inlet is within normal limits. Review of the MIP images confirms the above findings CTA HEAD FINDINGS Anterior circulation: Atherosclerotic calcifications are present the cavernous right internal carotid artery without a significant stenosis through the ICA terminus. The left internal carotid artery is occluded through the posterior communicating artery. The ICA termini are within normal limits bilaterally. The A1 segments are patent. Anterior communicating artery is patent. Right M1 segment is normal. The left M1 is occluded. Poor distal collaterals are present in the left MCA territory. Right MCA branch vessels demonstrate some distal branch vessel irregularity without a significant proximal stenosis or occlusion. Left-sided collaterals are best posteriorly. Posterior circulation: The right vertebral are artery is dominant above the dural margin. The left V4 segment is hypoplastic compared to the right. PICA origins are visualized and normal. The vertebrobasilar junction is normal. The basilar artery is normal. Both posterior cerebral arteries originate from the basilar tip. Posterior communicating arteries are present bilaterally, left more prominent than right. The PCA branch vessels are within normal limits. Venous sinuses: The dural sinuses are patent. Right transverse sinus is dominant. The straight sinus and deep cerebral veins are intact. Cortical veins are within normal limits. No vascular malformations are present. Anatomic variants: Prominent posterior communicating  arteries bilaterally. The left posterior communicating artery feeds  the left ICA in addition to the anterior communicating artery. Review of the MIP images confirms the above findings CT Brain Perfusion Findings: ASPECTS: 10/10 CBF (<30%) Volume: 52mL Perfusion (Tmax>6.0s) volume: 149mL Mismatch Volume: 68mL Infarction Location:Anterior left MCA territory IMPRESSION: 1. Acute anterior left MCA territory infarct. 2. Ischemic penumbra involving the entirety of the posterior portion of the left MCA territory which is partially fed by collaterals. 3. Emergent large vessel occlusion of the distal left M1 segment. 4. 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 5. Atherosclerotic changes at the aortic arch and carotid bifurcations without significant stenosis otherwise. 6. Distal small vessel disease throughout the circle-of-Willis without other significant proximal stenosis, aneurysm, or branch vessel occlusion. 7. Multilevel spondylosis in the cervical spine. These results were called by telephone at the time of interpretation on 09/04/2019 at 3:34 pm to provider MCNEILL [facility] , who verbally acknowledged these results. Electronically Signed   By: San Morelle M.D.   On: 09/04/2019 15:41   MR BRAIN WO CONTRAST  Result Date: 09/05/2019 CLINICAL DATA:  Stroke follow-up EXAM: MRI HEAD WITHOUT CONTRAST TECHNIQUE: Multiplanar, multiecho pulse sequences of the brain and surrounding structures were obtained without intravenous contrast. COMPARISON:  CT head 09/04/2019 FINDINGS: Brain: Large territory left MCA acute infarct. Infarct involves left caudate, with partial sparing of left putamen. Extensive infarct throughout the left frontal, temporal, and parietal lobe. Mild amount of hemorrhage in the left caudate. Mild mass-effect and mild midline shift to the right. Mild chronic microvascular ischemic change in the white matter.  Chronic microhemorrhage high right parietal lobe and high left frontal lobe. No mass lesion. Negative for hydrocephalus. Vascular: Loss of flow void left internal carotid artery compatible with occlusion. Skull and upper cervical spine: Negative Sinuses/Orbits: Mild mucosal edema paranasal sinuses. Bilateral cataract surgery Other: None IMPRESSION: Large territory left MCA infarct. Mild amount of hemorrhage left putamen Occlusion left internal carotid artery Electronically Signed   By: Franchot Gallo M.D.   On: 09/05/2019 15:12   CT Code Stroke Cerebral Perfusion with contrast  Result Date: 09/04/2019 CLINICAL DATA:  Focal neuro deficit of greater than 6 hours. Stroke suspected. Right-sided facial droop. EXAM: CT ANGIOGRAPHY HEAD AND NECK CT PERFUSION BRAIN TECHNIQUE: Multidetector CT imaging of the head and neck was performed using the standard protocol during bolus administration of intravenous contrast. Multiplanar CT image reconstructions and MIPs were obtained to evaluate the vascular anatomy. Carotid stenosis measurements (when applicable) are obtained utilizing NASCET criteria, using the distal internal carotid diameter as the denominator. Multiphase CT imaging of the brain was performed following IV bolus contrast injection. Subsequent parametric perfusion maps were calculated using RAPID software. CONTRAST:  174mL OMNIPAQUE IOHEXOL 350 MG/ML SOLN COMPARISON:  CT head without contrast 09/04/2019 FINDINGS: CTA NECK FINDINGS Aortic arch: Atherosclerotic calcifications are present at the aortic arch without aneurysm or significant stenosis. Right carotid system: The right common carotid artery is within normal limits. Atherosclerotic changes are noted at the right carotid bifurcation without a significant stenosis relative to the more distal vessel. Mural calcifications are present in the distal cervical right ICA, just below the skull base. Left carotid system: The left common carotid artery is within  normal limits. Left ICA is occluded at the bifurcation without significant reconstitution in the neck. Vertebral arteries: The right vertebral artery is the dominant vessel. Both vertebral arteries originate from the subclavian arteries without significant stenosis. There is no significant stenosis of either  vertebral artery in the neck. Skeleton: Degenerative endplate changes are present cervical spine from C2-3 through C6-7. Osseous foraminal narrowing is greatest at C3-4 and C4-5. No focal lytic or blastic lesions are present. Other neck: The soft tissues the neck are otherwise unremarkable. No focal mucosal or submucosal lesions are present. Salivary glands are within normal limits. Upper chest: Mild dependent atelectasis is present in the right upper lobe. Lung apices are otherwise clear. Thoracic inlet is within normal limits. Review of the MIP images confirms the above findings CTA HEAD FINDINGS Anterior circulation: Atherosclerotic calcifications are present the cavernous right internal carotid artery without a significant stenosis through the ICA terminus. The left internal carotid artery is occluded through the posterior communicating artery. The ICA termini are within normal limits bilaterally. The A1 segments are patent. Anterior communicating artery is patent. Right M1 segment is normal. The left M1 is occluded. Poor distal collaterals are present in the left MCA territory. Right MCA branch vessels demonstrate some distal branch vessel irregularity without a significant proximal stenosis or occlusion. Left-sided collaterals are best posteriorly. Posterior circulation: The right vertebral are artery is dominant above the dural margin. The left V4 segment is hypoplastic compared to the right. PICA origins are visualized and normal. The vertebrobasilar junction is normal. The basilar artery is normal. Both posterior cerebral arteries originate from the basilar tip. Posterior communicating arteries are  present bilaterally, left more prominent than right. The PCA branch vessels are within normal limits. Venous sinuses: The dural sinuses are patent. Right transverse sinus is dominant. The straight sinus and deep cerebral veins are intact. Cortical veins are within normal limits. No vascular malformations are present. Anatomic variants: Prominent posterior communicating arteries bilaterally. The left posterior communicating artery feeds the left ICA in addition to the anterior communicating artery. Review of the MIP images confirms the above findings CT Brain Perfusion Findings: ASPECTS: 10/10 CBF (<30%) Volume: 60mL Perfusion (Tmax>6.0s) volume: 163mL Mismatch Volume: 22mL Infarction Location:Anterior left MCA territory IMPRESSION: 1. Acute anterior left MCA territory infarct. 2. Ischemic penumbra involving the entirety of the posterior portion of the left MCA territory which is partially fed by collaterals. 3. Emergent large vessel occlusion of the distal left M1 segment. 4. 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 5. Atherosclerotic changes at the aortic arch and carotid bifurcations without significant stenosis otherwise. 6. Distal small vessel disease throughout the circle-of-Willis without other significant proximal stenosis, aneurysm, or branch vessel occlusion. 7. Multilevel spondylosis in the cervical spine. These results were called by telephone at the time of interpretation on 09/04/2019 at 3:34 pm to provider MCNEILL [facility] , who verbally acknowledged these results. Electronically Signed   By: San Morelle M.D.   On: 09/04/2019 15:41   ECHOCARDIOGRAM COMPLETE  Result Date: 09/05/2019   ECHOCARDIOGRAM REPORT   Patient Name:   SAIDEE CASSENS Date of Exam: 09/05/2019 Medical Rec #:  [id number]      Height:       62.2 in Accession #:    [id number]     Weight:       142.0 lb Date of Birth:  1925-11-10        BSA:          1.66 m Patient Age:    22 years       BP:           141/66 mmHg Patient Gender: F  HR:           70 bpm. Exam Location:  Inpatient Procedure: 2D Echo, Cardiac Doppler and Color Doppler Indications:    Stroke 434.91  History:        Patient has prior history of Echocardiogram examinations, most                 recent 07/20/2005. Stroke; Risk Factors:Hypertension, Diabetes                 and Former Smoker.  Sonographer:    Paulita Fujita RDCS Referring Phys: Swartz  1. Left ventricular ejection fraction, by visual estimation, is 60 to 65%. The left ventricle has normal function. There is no left ventricular hypertrophy.  2. Elevated left atrial pressure.  3. Left ventricular diastolic parameters are consistent with Grade II diastolic dysfunction (pseudonormalization).  4. The left ventricle has no regional wall motion abnormalities.  5. Global right ventricle has normal systolic function.The right ventricular size is normal.  6. Left atrial size was mildly dilated.  7. Right atrial size was mildly dilated.  8. Mild mitral annular calcification.  9. The mitral valve is normal in structure. Mild to moderate mitral valve regurgitation. No evidence of mitral stenosis. 10. The tricuspid valve is normal in structure. 11. The aortic valve is tricuspid. Aortic valve regurgitation is mild. Mild aortic valve sclerosis without stenosis. 12. The pulmonic valve was normal in structure. Pulmonic valve regurgitation is trivial. 13. Moderately elevated pulmonary artery systolic pressure. 14. The inferior vena cava is normal in size with greater than 50% respiratory variability, suggesting right atrial pressure of 3 mmHg. 15. Normal LV systolic function; grade 2 diastolic dysfunction; mild AI; mild to moderate MR; biatrial enlargement; akinesis of the apical RV free wall; mild TR; moderate pulmonary hypertension. FINDINGS  Left Ventricle: Left ventricular ejection fraction, by  visual estimation, is 60 to 65%. The left ventricle has normal function. The left ventricle has no regional wall motion abnormalities. There is no left ventricular hypertrophy. Left ventricular diastolic parameters are consistent with Grade II diastolic dysfunction (pseudonormalization). Elevated left atrial pressure. Right Ventricle: The right ventricular size is normal.Global RV systolic function is has normal systolic function. The tricuspid regurgitant velocity is 3.45 m/s, and with an assumed right atrial pressure of 3 mmHg, the estimated right ventricular systolic pressure is moderately elevated at 50.6 mmHg. Left Atrium: Left atrial size was mildly dilated. Right Atrium: Right atrial size was mildly dilated Pericardium: There is no evidence of pericardial effusion. Mitral Valve: The mitral valve is normal in structure. Mild mitral annular calcification. Mild to moderate mitral valve regurgitation. No evidence of mitral valve stenosis by observation. Tricuspid Valve: The tricuspid valve is normal in structure. Tricuspid valve regurgitation is mild. Aortic Valve: The aortic valve is tricuspid. Aortic valve regurgitation is mild. Aortic regurgitation PHT measures 459 msec. Mild aortic valve sclerosis is present, with no evidence of aortic valve stenosis. Pulmonic Valve: The pulmonic valve was normal in structure. Pulmonic valve regurgitation is trivial. Pulmonic regurgitation is trivial. Aorta: The aortic root is normal in size and structure. Venous: The inferior vena cava is normal in size with greater than 50% respiratory variability, suggesting right atrial pressure of 3 mmHg.  Additional Comments: Normal LV systolic function; grade 2 diastolic dysfunction; mild AI; mild to moderate MR; biatrial enlargement; akinesis of the apical RV free wall; mild TR; moderate pulmonary hypertension.  LEFT VENTRICLE PLAX 2D LVIDd:  4.52 cm       Diastology LVIDs:         3.13 cm       LV e' lateral:   7.40 cm/s LV  PW:         0.85 cm       LV E/e' lateral: 16.8 LV IVS:        0.83 cm       LV e' medial:    5.77 cm/s LVOT diam:     1.50 cm       LV E/e' medial:  21.5 LV SV:         55 ml LV SV Index:   32.19 LVOT Area:     1.77 cm  LV Volumes (MOD) LV area d, A2C:    25.90 cm LV area d, A4C:    26.70 cm LV area s, A2C:    14.00 cm LV area s, A4C:    15.30 cm LV major d, A2C:   7.39 cm LV major d, A4C:   6.98 cm LV major s, A2C:   5.95 cm LV major s, A4C:   6.18 cm LV vol d, MOD A2C: 77.1 ml LV vol d, MOD A4C: 85.7 ml LV vol s, MOD A2C: 27.8 ml LV vol s, MOD A4C: 32.9 ml LV SV MOD A2C:     49.3 ml LV SV MOD A4C:     85.7 ml LV SV MOD BP:      53.4 ml RIGHT VENTRICLE RV S prime:     10.90 cm/s TAPSE (M-mode): 1.9 cm LEFT ATRIUM             Index       RIGHT ATRIUM           Index LA diam:        3.90 cm 2.35 cm/m  RA Area:     20.90 cm LA Vol (A2C):   41.2 ml 24.86 ml/m RA Volume:   66.20 ml  39.95 ml/m LA Vol (A4C):   42.6 ml 25.71 ml/m LA Biplane Vol: 44.5 ml 26.85 ml/m  AORTIC VALVE LVOT Vmax:   102.00 cm/s LVOT Vmean:  67.800 cm/s LVOT VTI:    0.243 m AI PHT:      459 msec  AORTA Ao Root diam: 2.80 cm MITRAL VALVE                         TRICUSPID VALVE MV Area (PHT): 3.77 cm              TR Peak grad:   47.6 mmHg MV PHT:        58.29 msec            TR Vmax:        345.00 cm/s MV Decel Time: 201 msec MR Peak grad: 127.2 mmHg             SHUNTS MR Mean grad: 91.0 mmHg              Systemic VTI:  0.24 m MR Vmax:      564.00 cm/s            Systemic Diam: 1.50 cm MR Vmean:     453.0 cm/s MV E velocity: 124.00 cm/s 103 cm/s MV A velocity: 60.70 cm/s  70.3 cm/s MV E/A ratio:  2.04        1.5  Kirk Ruths MD Electronically signed by Kirk Ruths  MD Signature Date/Time: 09/05/2019/10:20:08 AM    Final    CT HEAD CODE STROKE WO CONTRAST  Result Date: 09/04/2019 CLINICAL DATA:  Code stroke.  Right facial droop EXAM: CT HEAD WITHOUT CONTRAST TECHNIQUE: Contiguous axial images were obtained from the base of the  skull through the vertex without intravenous contrast. COMPARISON:  None. FINDINGS: Brain: There is no acute intracranial hemorrhage, mass effect, or edema. Gray-white differentiation remains preserved. Patchy hypoattenuation in the supratentorial white matter is nonspecific but probably reflects mild to moderate chronic microvascular ischemic changes. The there is no extra-axial fluid collection. Prominence of the ventricles and sulci reflects minor generalized parenchymal volume loss. Vascular: There is hyperdensity of the left carotid terminus and MCA. Skull: Unremarkable. Sinuses/Orbits: Minor mucosal thickening Other: Mastoid air cells are clear. ASPECTS [facility] Stroke Program Early CT Score) - Ganglionic level infarction (caudate, lentiform nuclei, internal capsule, insula, M1-M3 cortex): 7 - Supraganglionic infarction (M4-M6 cortex): 3 Total score (0-10 with 10 being normal): 10 IMPRESSION: 1. No acute intracranial hemorrhage or evidence of acute infarction. Increased density of left carotid terminus and MCA suggesting intraluminal thrombus. 2. ASPECTS is 10 These results were communicated to Dr. Leonel Ramsay at 3:05 pmon 12/22/2020by text page via the [facility] messaging system. Electronically Signed   By: Macy Mis M.D.   On: 09/04/2019 15:08   ECG - SR rate  BPM. (See cardiology reading for complete details)  PHYSICAL EXAM    Constitutional: Appears frail elderly Caucasian lady in mild distress Psych: unable Eyes: No scleral injection HENT: No OP obstrucion Head: Normocephalic.  Cardiovascular: Normal rate and regular rhythm.  Respiratory: Effort normal and breath sounds normal to anterior ascultation GI: Soft.  No distension. There is no tenderness.  Skin: WDI  Neuro: Mental Status: She is awake alert globally aphasic.  Does not follow commands and will not follow gaze.  She is mute Cranial Nerves: II: Visual Fields w/decreased blink to threat on right. Pupils are equal, round, and  reactive to light.   III,IV, VI: gaze forced deviation to the left, did not cross midline.  V: unable to assess VII: Right facial droop VIII: hearing seems intact X: Uvula not able to assess XI: Shoulder shrug not able to assess XII: tongue is midline  Motor: Tone is normal. Bulk is normal. Some spontaneous left side movement, not able to fully participate. Right arm and right leg flaccid  Sensory: Sensation seems symmtric to noxious stimuli; limited testing Deep Tendon Reflexes: Not tested  Plantars: Toes are downgoing bilaterally.  Cerebellar Unable to test    ASSESSMENT/PLAN Ms. [name] [name] is a 85 y.o. female who presented with L MCA syndrome. She was treated with IV Alteplase 09/04/2019 at 1511, but unfortunately, not a candidate for thrombectomy d/t significant infarct area on CTP.   Stroke: large cardieoemboic LMCA infarct s/p tPA with mild hemorrhage.  Etiology left ICA occlusion  Code Stroke CT Head -    ASPECTS 10  CTA H&N - L MCA oculsion  CT Perfusion- large core  CT head - neg  MRI head- large L MCA infarct, mild hemorrhage L putamen   2D Echo - EF 60-65%. No source of embolus. B atrium dilated   [employer] Virus 2  neg  LDL - 41  HgbA1c - 6.1  UDS not done  VTE prophylaxis - SCDs -> change to Lovenox    81mg  ASA prior to admission, now on no antithrombotic secondary to hemorrhage    Therapy recommendations:  Pending.ok to be  OOB.  Disposition:  Pending  DNR  Hypertension  Home BP meds: zestril  Current BP meds: none   Stable . SBP < 180    . Long-term BP goal normotensive  Hyperlipidemia  Home Lipid lowering medication: lipitor 10mg   LDL 41, goal < 70  Current lipid lowering medication:  lipitor 10  Continue statin at discharge pending [place]  Diabetes  Home diabetic meds: glucophage  Current diabetic meds: SSI  HgbA1c 6.1, goal < 7.0 Recent Labs    09/05/19 2003  GLUCAP 120*    Dysphagia . Secondary to  stroke . NPO . Speech on board . For MBSS today  . May need temporary feeding tube if she fails swallow eval   Other Stroke Risk Factors  Advanced age  Other [facility] day # 2 Patient's prognosis remains poor given significant global aphasia and dense hemiplegia.  Speech therapy to check swallow eval today and if she fails may need feeding tube.  Long discussion with the patient's daughter over the phone and answered questions.  Family will decide about goals of care and palliative care soon and let us know.  Transfer to neurology floor bed.  Greater than 50% time during this 35-minute visit was spent on counseling and coordination of care and discussion with care team and family and answering questions  Antony Contras, [place] Pager: [phone] 09/06/2019 9:57 AM  To contact Stroke Continuity provider, please refer to [email or web address] After hours, contact General Neurology

## 2019-09-06 NOTE — Progress Notes (Signed)
Modified Barium Swallow Progress Note  Patient Details  Name: [name] [name] MRN: [id number] Date of Birth: [birth year]  Today's Date: 09/06/2019  Modified Barium Swallow completed.  Full report located under Chart Review in the Imaging Section.  Brief recommendations include the following:  Clinical Impression  Pt presents with moderate-severe oropharyngeal dysphagia c/b decreased lingual strength and coordination, reduced labial seal, delayed swallow initiation, decreased pharyngeal constriction, reduced base of tongue retraction, incomplete laryngeal closure, decreased epiglottic inversion, reduced laryngeal elevation and excursion, and diminished sensation.  These deficts resulted in aspiration of nectar thick liquid by cup and spoon.  Pt exhibited cough response, but was unable to fully clear penetration/aspiration. With honey thick liquid by cup and spoon there was intermittent trace, transient penetration, but no aspiration.  There was no penetration or aspiration with puree.  There was however, significant oral and pharyngeal residue with both honey thick liquid and puree consistencies requiring multiple cleansing swallows to reduce residue, which still did not fully clear.  Although pt appears to be protecting airway during the swallow, there is a risk of aspiration given significant amount of residue in pharynx.  Pt may have puree snacks and honey thick liquids with direct supervision.  Pt is unlikely to be able to meet nutritional needs orally and may not be safe for a full oral diet at this time given amount of pharyngeal residue and difficulty with clearance.  Recommend alternate means of nutrition for nutritional support.  Pt is not a good candidate for swallowing therapy at this time 2/2 cognitive impairments.  SLP will follow for diet tolerance and readiness for advancement.   Swallow Evaluation Recommendations       SLP Diet Recommendations: Honey thick liquids;Dysphagia 1 (Puree)  solids(snacks with supervision only)   Liquid Administration via: Cup   Medication Administration: Via alternative means   Supervision: Full assist for feeding   Compensations: Slow rate;Small sips/bites;Multiple dry swallows after each bite/sip   Postural Changes: Seated upright at 90 degrees   Oral Care Recommendations: Oral care QID        Celedonio Savage, [place], [place] Office: [phone] 09/06/2019,12:52 PM

## 2019-09-06 NOTE — Progress Notes (Signed)
PT Cancellation Note  Patient Details Name: LISSETT FOUGHT MRN: [id number] DOB: [birth year]   Cancelled Treatment:    Reason Eval/Treat Not Completed: Active bedrest order Will await increase in activity orders prior to PT evaluation. Will follow.   Marguarite Arbour A Aileene Lanum 09/06/2019, 6:48 AM Marisa Severin, PT, DPT Acute Rehabilitation Services Pager [phone] Office [phone]

## 2019-09-06 NOTE — Progress Notes (Signed)
Called by RN for rhythm strip reading a-fib with RVR, HR in 130s. Reviewed strip with telemetry staff, confirming atrial fibrillation. The patient has no prior history of a-fib based on chart review in [facility] records as well as Care Everywhere.   Discussed with CCM. This may be new onset a-fib, but more likely is first detected event of pre-existing paroxysmal a-fib. Will administer 5 mg IV metoprolol, then start 25 mg BID metoprolol via tube one hour after the IV dose. Will obtain Cardiology consult for the AM to assess possible new onset a-fib with potential for cardioversion.   Electronically signed: Dr. Kerney Elbe

## 2019-09-07 DIAGNOSIS — I69320 Aphasia following cerebral infarction: Secondary | ICD-10-CM

## 2019-09-07 LAB — GLUCOSE, CAPILLARY
Glucose-Capillary: 183 mg/dL — ABNORMAL HIGH (ref 70–99)
Glucose-Capillary: 189 mg/dL — ABNORMAL HIGH (ref 70–99)
Glucose-Capillary: 199 mg/dL — ABNORMAL HIGH (ref 70–99)
Glucose-Capillary: 249 mg/dL — ABNORMAL HIGH (ref 70–99)
Glucose-Capillary: 267 mg/dL — ABNORMAL HIGH (ref 70–99)
Glucose-Capillary: 295 mg/dL — ABNORMAL HIGH (ref 70–99)

## 2019-09-07 MED ORDER — PANTOPRAZOLE SODIUM 40 MG PO PACK: 40.0000 mg | PACK | Freq: Every day | ORAL | Status: DC

## 2019-09-07 MED ORDER — INSULIN ASPART 100 UNIT/ML ~~LOC~~ SOLN
0.0000 [IU] | Freq: Three times a day (TID) | SUBCUTANEOUS | Status: DC
  Administered 2019-09-07: 5 [IU] via SUBCUTANEOUS

## 2019-09-07 MED ORDER — PANTOPRAZOLE SODIUM 40 MG PO PACK
40.0000 mg | PACK | Freq: Every day | ORAL | Status: DC
  Administered 2019-09-08: 40 mg
  Filled 2019-09-07: qty 20

## 2019-09-07 MED ORDER — LISINOPRIL 2.5 MG PO TABS
2.5000 mg | ORAL_TABLET | Freq: Every day | ORAL | Status: DC
  Administered 2019-09-07 – 2019-09-08 (×2): 2.5 mg
  Filled 2019-09-07 (×2): qty 1

## 2019-09-07 MED ORDER — INSULIN ASPART 100 UNIT/ML ~~LOC~~ SOLN
0.0000 [IU] | Freq: Every day | SUBCUTANEOUS | Status: DC
  Administered 2019-09-07: 3 [IU] via SUBCUTANEOUS

## 2019-09-07 MED ORDER — ATORVASTATIN CALCIUM 10 MG PO TABS
10.0000 mg | ORAL_TABLET | Freq: Every day | ORAL | Status: DC
  Administered 2019-09-08: 10:00:00 10 mg
  Filled 2019-09-07: qty 1

## 2019-09-07 NOTE — Plan of Care (Signed)
  Problem: Nutrition: Goal: Risk of aspiration will decrease Outcome: Progressing   Problem: Activity: Goal: Risk for activity intolerance will decrease Outcome: Not Progressing

## 2019-09-07 NOTE — Progress Notes (Signed)
Pt is in Afib AVR, HR is between 130-140. Dr. Earnestine Leys was made aware. Pt was given Lopressor 5mg  IV as ordered by Dr. Earnestine Leys. Repeat HR=112.

## 2019-09-07 NOTE — Progress Notes (Signed)
STROKE TEAM PROGRESS NOTE   INTERVAL HISTORY Patient is resting comfortably in bed.  She remains globally aphasic with dense hemiplegia.  She has been cleared for dysphagia 1 diet.  She was found to be in A. fib with rapid ventricular rate in the 130s last night.  She was started on metoprolol.  Last night.  OBJECTIVE Vitals:   09/07/19 0132 09/07/19 0500 09/07/19 0506 09/07/19 0735  BP: (!) 160/93  (!) 157/97 (!) 150/84  Pulse: (!) 122   (!) 101  Resp:   18 20  Temp:   99 F (37.2 C) 98.8 F (37.1 C)  TempSrc:   Oral Axillary  SpO2:   97% 97%  Weight:  65.1 kg    Height:       CBC:  Recent Labs  Lab 09/04/19 1451 09/04/19 1459  WBC 8.8  --   NEUTROABS 6.7  --   HGB 11.0* 10.9*  HCT 34.6* 32.0*  MCV 88.7  --   PLT 112*  --    Basic Metabolic Panel:  Recent Labs  Lab 09/04/19 1451 09/04/19 1459  NA 138 139  K 4.0 4.0  CL 105 103  CO2 21*  --   GLUCOSE 129* 126*  BUN 13 14  CREATININE 0.76 0.70  CALCIUM 8.9  --    Lipid Panel:     Component Value Date/Time   CHOL 91 09/05/2019 0522   TRIG 67 09/05/2019 0522   HDL 37 (L) 09/05/2019 0522   CHOLHDL 2.5 09/05/2019 0522   VLDL 13 09/05/2019 0522   LDLCALC 41 09/05/2019 0522   HgbA1c:  Lab Results  Component Value Date   HGBA1C 6.1 (H) 09/05/2019   IMAGING MR BRAIN WO CONTRAST  Result Date: 09/05/2019 CLINICAL DATA:  Stroke follow-up EXAM: MRI HEAD WITHOUT CONTRAST TECHNIQUE: Multiplanar, multiecho pulse sequences of the brain and surrounding structures were obtained without intravenous contrast. COMPARISON:  CT head 09/04/2019 FINDINGS: Brain: Large territory left MCA acute infarct. Infarct involves left caudate, with partial sparing of left putamen. Extensive infarct throughout the left frontal, temporal, and parietal lobe. Mild amount of hemorrhage in the left caudate. Mild mass-effect and mild midline shift to the right. Mild chronic microvascular ischemic change in the white matter. Chronic microhemorrhage  high right parietal lobe and high left frontal lobe. No mass lesion. Negative for hydrocephalus. Vascular: Loss of flow void left internal carotid artery compatible with occlusion. Skull and upper cervical spine: Negative Sinuses/Orbits: Mild mucosal edema paranasal sinuses. Bilateral cataract surgery Other: None IMPRESSION: Large territory left MCA infarct. Mild amount of hemorrhage left putamen Occlusion left internal carotid artery Electronically Signed   By: Franchot Gallo M.D.   On: 09/05/2019 15:12   DG Abd Portable 1V  Result Date: 09/06/2019 CLINICAL DATA:  NG tube placement. EXAM: PORTABLE ABDOMEN - 1 VIEW COMPARISON:  Radiograph dated 07/14/2017 FINDINGS: NG tube has been inserted and the tip is in the midbody of the stomach. Bowel gas pattern is normal. No acute bone abnormality. Aortic atherosclerosis. IMPRESSION: Benign-appearing abdomen. Nasogastric tube tip is in the midbody of the stomach. Aortic Atherosclerosis (ICD10-I70.0). Electronically Signed   By: Lorriane Shire M.D.   On: 09/06/2019 18:16   DG Swallowing Func-Speech Pathology  Result Date: 09/06/2019 Objective Swallowing Evaluation: Type of Study: MBS-Modified Barium Swallow Study  Patient Details Name: [name] [name] MRN: [id number] Date of Birth: September 21, 1925 Today's Date: 09/06/2019 Time: SLP Start Time (ACUTE ONLY): 1201 -SLP Stop Time (ACUTE ONLY): 1213 SLP Time Calculation (min) (  ACUTE ONLY): 12 min Past Medical History: Past Medical History: Diagnosis Date . Colon polyps  . Diabetes mellitus without complication ([place]) [id number] . Gallstones  . GERD (gastroesophageal reflux disease)  . Hiatal hernia  . HLD (hyperlipidemia)  . Hypertension  . Pancreatitis  . Skin cancer of face   nose Past Surgical History: Past Surgical History: Procedure Laterality Date . ABDOMINAL HYSTERECTOMY  1972 . APPENDECTOMY  1972 . BREAST BIOPSY   . carpel tunnel Bilateral ~2010 . CHOLECYSTECTOMY  2008 . cornea trasplant  2007  at [facility] . ERCP N/A 05/17/2014   Procedure: ENDOSCOPIC RETROGRADE CHOLANGIOPANCREATOGRAPHY (ERCP);  Surgeon: Inda Castle, MD;  Location: [place];  Service: Endoscopy;  Laterality: N/A; . HIATAL HERNIA REPAIR  2006 . TONSILLECTOMY   HPI: 85yo female admitted 09/04/2019 with right weakness, facial droop, aphasia. TPA given. PMH: HTN, HLD, DM2, GERD, pancreatitis. CTAhead/neck - LMCA occlusion. MRI 12/23 revealed large L MCA infarct including subcortical structures. "Large territory left MCA acute infarct. Infarct involves left caudate, with partial sparing of left putamen. Extensive infarct throughout the left frontal, temporal, and parietal lobe. Mild amount of hemorrhage in the left caudate. Mild mass-effect and mild midline shift to the right."  Subjective: Pt awake, alert Assessment / Plan / Recommendation CHL IP CLINICAL IMPRESSIONS 09/06/2019 Clinical Impression Pt presents with moderate-severe oropharyngeal dysphagia c/b decreased lingual strength and coordination, reduced labial seal, delayed swallow initiation, decreased pharyngeal constriction, reduced base of tongue retraction, incomplete laryngeal closure, decreased epiglottic inversion, reduced laryngeal elevation and excursion, and diminished sensation.  These deficts resulted in aspiration of nectar thick liquid by cup and spoon.  Pt exhibited cough response, but was unable to fully clear penetration/aspiration. With honey thick liquid by cup and spoon there was intermittent trace, transient penetration, but no aspiration.  There was no penetration or aspiration with puree.  There was however, significant oral and pharyngeal residue with both honey thick liquid and puree consistencies requiring multiple cleansing swallows to reduce residue, which still did not fully clear.  Although pt appears to be protecting airway during the swallow, there is a risk of aspiration given significant amount of residue in pharynx.  Pt may have puree snacks and honey thick liquids with direct  supervision.  Pt is unlikely to be able to meet nutritional needs orally and may not be safe for a full oral diet at this time given amount of pharyngeal residue and difficulty with clearance.  Recommend alternate means of nutrition for nutritional support.  Pt is not a good candidate for swallowing therapy at this time 2/2 cognitive impairments.  SLP will follow for diet tolerance and readiness for advancement. SLP Visit Diagnosis Dysphagia, oropharyngeal phase (R13.12) Attention and concentration deficit following -- Frontal lobe and executive function deficit following -- Impact on safety and function Moderate aspiration risk   CHL IP TREATMENT RECOMMENDATION 09/06/2019 Treatment Recommendations Therapy as outlined in treatment plan below   Prognosis 09/06/2019 Prognosis for Safe Diet Advancement Fair Barriers to Reach Goals -- Barriers/Prognosis Comment -- CHL IP DIET RECOMMENDATION 09/06/2019 SLP Diet Recommendations Honey thick liquids;Dysphagia 1 (Puree) solids Liquid Administration via Cup Medication Administration Via alternative means Compensations Slow rate;Small sips/bites;Multiple dry swallows after each bite/sip Postural Changes Seated upright at 90 degrees   CHL IP OTHER RECOMMENDATIONS 09/06/2019 Recommended Consults -- Oral Care Recommendations Oral care QID Other Recommendations --   CHL IP FOLLOW UP RECOMMENDATIONS 09/06/2019 Follow up Recommendations (No Data)   CHL IP FREQUENCY AND DURATION 09/06/2019 Speech Therapy Frequency (ACUTE  ONLY) min 2x/week Treatment Duration 2 weeks      CHL IP ORAL PHASE 09/06/2019 Oral Phase Impaired Oral - Pudding Teaspoon -- Oral - Pudding Cup -- Oral - Honey Teaspoon Right anterior bolus loss;Piecemeal swallowing;Lingual/palatal residue;Premature spillage Oral - Honey Cup Right anterior bolus loss;Piecemeal swallowing;Lingual/palatal residue;Premature spillage Oral - Nectar Teaspoon Right anterior bolus loss;Lingual/palatal residue;Premature spillage Oral -  Nectar Cup Right anterior bolus loss Oral - Nectar Straw -- Oral - Thin Teaspoon -- Oral - Thin Cup -- Oral - Thin Straw -- Oral - Puree Reduced posterior propulsion;Lingual pumping;Piecemeal swallowing;Lingual/palatal residue;Decreased bolus cohesion;Premature spillage Oral - Mech Soft -- Oral - Regular -- Oral - Multi-Consistency -- Oral - Pill -- Oral Phase - Comment --  CHL IP PHARYNGEAL PHASE 09/06/2019 Pharyngeal Phase Impaired Pharyngeal- Pudding Teaspoon -- Pharyngeal -- Pharyngeal- Pudding Cup -- Pharyngeal -- Pharyngeal- Honey Teaspoon Delayed swallow initiation-vallecula;Reduced epiglottic inversion;Reduced laryngeal elevation;Reduced airway/laryngeal closure;Reduced tongue base retraction;Pharyngeal residue - valleculae;Pharyngeal residue - pyriform;Pharyngeal residue - posterior pharnyx;Pharyngeal residue - cp segment Pharyngeal Material does not enter airway Pharyngeal- Honey Cup Delayed swallow initiation-vallecula;Reduced epiglottic inversion;Reduced laryngeal elevation;Reduced airway/laryngeal closure;Reduced tongue base retraction;Pharyngeal residue - valleculae;Pharyngeal residue - pyriform;Pharyngeal residue - posterior pharnyx;Pharyngeal residue - cp segment Pharyngeal Material does not enter airway;Material enters airway, remains ABOVE vocal cords then ejected out Pharyngeal- Nectar Teaspoon Delayed swallow initiation-vallecula;Reduced epiglottic inversion;Reduced anterior laryngeal mobility;Reduced laryngeal elevation;Reduced airway/laryngeal closure;Reduced tongue base retraction;Penetration/Aspiration during swallow;Penetration/Aspiration before swallow;Penetration/Apiration after swallow;Moderate aspiration;Pharyngeal residue - valleculae;Pharyngeal residue - pyriform;Pharyngeal residue - posterior pharnyx;Pharyngeal residue - cp segment Pharyngeal Material enters airway, passes BELOW cords without attempt by patient to eject out (silent aspiration);Material enters airway, passes BELOW  cords and not ejected out despite cough attempt by patient Pharyngeal- Nectar Cup Delayed swallow initiation-pyriform sinuses;Reduced epiglottic inversion;Reduced anterior laryngeal mobility;Reduced laryngeal elevation;Reduced airway/laryngeal closure;Reduced tongue base retraction;Penetration/Aspiration during swallow;Penetration/Apiration after swallow;Penetration/Aspiration before swallow;Moderate aspiration;Pharyngeal residue - valleculae;Pharyngeal residue - pyriform;Pharyngeal residue - posterior pharnyx;Pharyngeal residue - cp segment Pharyngeal Material enters airway, passes BELOW cords and not ejected out despite cough attempt by patient Pharyngeal- Nectar Straw -- Pharyngeal -- Pharyngeal- Thin Teaspoon -- Pharyngeal -- Pharyngeal- Thin Cup -- Pharyngeal -- Pharyngeal- Thin Straw -- Pharyngeal -- Pharyngeal- Puree Delayed swallow initiation-vallecula;Reduced pharyngeal peristalsis;Reduced epiglottic inversion;Reduced anterior laryngeal mobility;Reduced laryngeal elevation;Reduced airway/laryngeal closure;Reduced tongue base retraction;Pharyngeal residue - valleculae;Pharyngeal residue - posterior pharnyx;Pharyngeal residue - pyriform Pharyngeal Material does not enter airway Pharyngeal- Mechanical Soft -- Pharyngeal -- Pharyngeal- Regular -- Pharyngeal -- Pharyngeal- Multi-consistency -- Pharyngeal -- Pharyngeal- Pill -- Pharyngeal -- Pharyngeal Comment --  CHL IP CERVICAL ESOPHAGEAL PHASE 09/06/2019 Cervical Esophageal Phase WFL Pudding Teaspoon -- Pudding Cup -- Honey Teaspoon -- Honey Cup -- Nectar Teaspoon -- Nectar Cup -- Nectar Straw -- Thin Teaspoon -- Thin Cup -- Thin Straw -- Puree -- Mechanical Soft -- Regular -- Multi-consistency -- Pill -- Cervical Esophageal Comment -- Celedonio Savage, MA, CCC-SLP Acute Rehabilitation Services Office: [phone] 09/06/2019, 12:54 PM              ECG - SR rate  BPM. (See cardiology reading for complete details)  PHYSICAL EXAM     Constitutional: Appears  frail elderly Caucasian lady in mild distress Psych: unable Eyes: No scleral injection HENT: No OP obstrucion Head: Normocephalic.  Cardiovascular: Normal rate and regular rhythm.  Respiratory: Effort normal and breath sounds normal to anterior ascultation GI: Soft.  No distension. There is no tenderness.  Skin: WDI  Neuro: Mental Status: She is awake alert globally aphasic.  Does not  follow commands and will not follow gaze.  She is mute Cranial Nerves: II: Visual Fields w/decreased blink to threat on right. Pupils are equal, round, and reactive to light.   III,IV, VI: gaze forced deviation to the left, did not cross midline.  V: unable to assess VII: Right facial droop VIII: hearing seems intact X: Uvula not able to assess XI: Shoulder shrug not able to assess XII: tongue is midline  Motor: Tone is normal. Bulk is normal. Some spontaneous left side movement, not able to fully participate. Right arm and right leg flaccid  Sensory: Sensation seems symmtric to noxious stimuli; limited testing Deep Tendon Reflexes: Not tested  Plantars: Toes are downgoing bilaterally.  Cerebellar Unable to test    ASSESSMENT/PLAN [name] [name] is a 85 y.o. female who presented with L MCA syndrome. She was treated with IV Alteplase 09/04/2019 at 1511, but unfortunately, not a candidate for thrombectomy d/t significant infarct area on CTP.   Stroke: large cardieoemboic LMCA infarct s/p tPA with mild hemorrhage.  Etiology left ICA occlusion from atrial fibrillation   Code Stroke CT Head -    ASPECTS 10  CTA H&N - L MCA oculsion  CT Perfusion- large core  CT head - neg  MRI head- large L MCA infarct, mild hemorrhage L putamen   2D Echo - EF 60-65%. No source of embolus. B atrium dilated   [employer] Virus 2  neg  LDL - 41  HgbA1c - 6.1  UDS not done  VTE prophylaxis - SCDs -> change to Lovenox    81mg  ASA prior to admission, delayed antithrombotic secondary to post tPA  hemorrhage (mild)   Therapy recommendations:  SNF  Disposition:  Pending  DNR  Atrial Fibrillation w/ RVR, new onset 12/24  CHA2DS2-VASc Score = 7, ?2 oral anticoagulation recommended  Age in Years:  ?72   +2    Sex:  Female   Female   +1    Hypertension History:  yes   +1     Diabetes Mellitus:  yes   +1  Congestive Heart Failure History:  0  Vascular Disease History:  0     Stroke/TIA/Thromboembolism History:  yes   +2 . Metoprolol 25 bid added . TSH pending  . Cardiology consulted for new AF . Not an AC candidate d/t post tPA hemorrhage (mild) - Likely can start at d/c   Hypertension  Home BP meds: zestril, metoprolol 25 daily  Current BP meds: metoprolol 25 bid w/ new onset AF  Stable . SBP < 180    . Resume zestril per tube.  . Long-term BP goal normotensive  Hyperlipidemia  Home Lipid lowering medication: lipitor 10mg , lovaza  LDL 41, goal < 70  Current lipid lowering medication:  lipitor 10 mg  Will not use higher intensity statin secondary to advanced age  Continue statin at discharge   Diabetes  Home diabetic meds: glucophage  Current diabetic meds: none  HgbA1c 6.1, goal < 7.0 Recent Labs    09/07/19 0001 09/07/19 0326 09/07/19 0738  GLUCAP 183* 199* 189*  add CBGs and SSI  Dysphagia . Secondary to stroke . Cleared for MBSS today D1 honey thick liquids w/ supervision only  . NG placed w/ TF running at 50  . Meds via tube . Speech on board  Other Stroke Risk Factors  Advanced age  Other Active Problems  Dry eye on [facility] day # 3 Patient developed atrial fibrillation with rapid RVR last  night but rate seems now well better controlled on metoprolol.  We will hold off anticoagulation with Eliquis for at least a week given small amount of hemorrhagic transformation on a CT scan.  Continue aspirin for now.  No family available at the bedside.  Greater than 50% time during this 25-minute visit was spent on counseling and  coordination of care and discussion with care team.  Antony Contras, MD Medical Director [place] Pager: [phone] 09/07/2019 12:01 PM  To contact Stroke Continuity provider, please refer to [email or web address] After hours, contact General Neurology

## 2019-09-07 NOTE — Progress Notes (Signed)
Pt was transfered from 4n via bed by RN and tech. Pt is alert,  doesn't speak, and has a left gaze. Pt has nonpurposeful in LUE. Pt doesn't move her other extremities. VS taken. Pt placed on telemetry.

## 2019-09-08 ENCOUNTER — Inpatient Hospital Stay (HOSPITAL_COMMUNITY): Payer: Medicare Other

## 2019-09-08 ENCOUNTER — Encounter (HOSPITAL_COMMUNITY): Payer: Self-pay | Admitting: Neurology

## 2019-09-08 DIAGNOSIS — I4819 Other persistent atrial fibrillation: Secondary | ICD-10-CM

## 2019-09-08 DIAGNOSIS — E785 Hyperlipidemia, unspecified: Secondary | ICD-10-CM

## 2019-09-08 DIAGNOSIS — R1312 Dysphagia, oropharyngeal phase: Secondary | ICD-10-CM

## 2019-09-08 DIAGNOSIS — E1165 Type 2 diabetes mellitus with hyperglycemia: Secondary | ICD-10-CM

## 2019-09-08 DIAGNOSIS — I480 Paroxysmal atrial fibrillation: Secondary | ICD-10-CM

## 2019-09-08 DIAGNOSIS — I63412 Cerebral infarction due to embolism of left middle cerebral artery: Principal | ICD-10-CM

## 2019-09-08 DIAGNOSIS — Z4659 Encounter for fitting and adjustment of other gastrointestinal appliance and device: Secondary | ICD-10-CM

## 2019-09-08 DIAGNOSIS — I6340 Cerebral infarction due to embolism of unspecified cerebral artery: Secondary | ICD-10-CM

## 2019-09-08 LAB — BASIC METABOLIC PANEL
Anion gap: 13 (ref 5–15)
BUN: 26 mg/dL — ABNORMAL HIGH (ref 8–23)
CO2: 22 mmol/L (ref 22–32)
Calcium: 8 mg/dL — ABNORMAL LOW (ref 8.9–10.3)
Chloride: 103 mmol/L (ref 98–111)
Creatinine, Ser: 0.65 mg/dL (ref 0.44–1.00)
GFR calc Af Amer: >60 mL/min (ref >60)
GFR calc non Af Amer: >60 mL/min (ref >60)
Glucose, Bld: 302 mg/dL — ABNORMAL HIGH (ref 70–99)
Potassium: 3.8 mmol/L (ref 3.5–5.1)
Sodium: 138 mmol/L (ref 135–145)

## 2019-09-08 LAB — GLUCOSE, CAPILLARY
Glucose-Capillary: 158 mg/dL — ABNORMAL HIGH (ref 70–99)
Glucose-Capillary: 184 mg/dL — ABNORMAL HIGH (ref 70–99)
Glucose-Capillary: 232 mg/dL — ABNORMAL HIGH (ref 70–99)
Glucose-Capillary: 244 mg/dL — ABNORMAL HIGH (ref 70–99)
Glucose-Capillary: 322 mg/dL — ABNORMAL HIGH (ref 70–99)

## 2019-09-08 LAB — CBC
HCT: 38.4 % (ref 36.0–46.0)
Hemoglobin: 12.7 g/dL (ref 12.0–15.0)
MCH: 28.2 pg (ref 26.0–34.0)
MCHC: 33.1 g/dL (ref 30.0–36.0)
MCV: 85.3 fL (ref 80.0–100.0)
Platelets: 162 10*3/uL (ref 150–400)
RBC: 4.5 MIL/uL (ref 3.87–5.11)
RDW: 13.4 % (ref 11.5–15.5)
WBC: 11.6 10*3/uL — ABNORMAL HIGH (ref 4.0–10.5)
nRBC: 0 % (ref 0.0–0.2)

## 2019-09-08 LAB — TSH: TSH: 2.148 u[IU]/mL (ref 0.350–4.500)

## 2019-09-08 MED ORDER — BIOTENE DRY MOUTH MT LIQD: 15.0000 mL | OROMUCOSAL | Status: DC | PRN

## 2019-09-08 MED ORDER — GLYCOPYRROLATE 0.2 MG/ML IJ SOLN: 0.2000 mg | INTRAMUSCULAR | Status: DC | PRN

## 2019-09-08 MED ORDER — HALOPERIDOL LACTATE 5 MG/ML IJ SOLN: 0.5000 mg | INTRAMUSCULAR | Status: DC | PRN

## 2019-09-08 MED ORDER — ADULT MULTIVITAMIN W/MINERALS CH
1.0000 | ORAL_TABLET | Freq: Every day | ORAL | Status: DC
  Administered 2019-09-08: 10:00:00 1 via ORAL
  Filled 2019-09-08: qty 1

## 2019-09-08 MED ORDER — ONDANSETRON 4 MG PO TBDP: 4.0000 mg | ORAL_TABLET | Freq: Four times a day (QID) | ORAL | Status: DC | PRN

## 2019-09-08 MED ORDER — MORPHINE SULFATE (PF) 2 MG/ML IV SOLN
1.0000 mg | INTRAVENOUS | Status: DC | PRN
  Administered 2019-09-09 – 2019-09-10 (×2): 1 mg via INTRAVENOUS
  Filled 2019-09-08 (×2): qty 1

## 2019-09-08 MED ORDER — SCOPOLAMINE 1 MG/3DAYS TD PT72
1.0000 | MEDICATED_PATCH | TRANSDERMAL | Status: DC
  Administered 2019-09-08 – 2019-09-11 (×2): 1.5 mg via TRANSDERMAL
  Filled 2019-09-08 (×2): qty 1

## 2019-09-08 MED ORDER — FUROSEMIDE 10 MG/ML IJ SOLN
40.0000 mg | Freq: Once | INTRAMUSCULAR | Status: AC
  Administered 2019-09-08: 40 mg via INTRAVENOUS
  Filled 2019-09-08: qty 4

## 2019-09-08 MED ORDER — LORAZEPAM 2 MG/ML IJ SOLN
1.0000 mg | INTRAMUSCULAR | Status: DC | PRN
  Administered 2019-09-11: 1 mg via INTRAVENOUS
  Filled 2019-09-08: qty 1

## 2019-09-08 MED ORDER — ACETAMINOPHEN 650 MG RE SUPP: 650.0000 mg | Freq: Four times a day (QID) | RECTAL | Status: DC | PRN

## 2019-09-08 MED ORDER — POLYVINYL ALCOHOL 1.4 % OP SOLN
1.0000 [drp] | Freq: Four times a day (QID) | OPHTHALMIC | Status: DC | PRN
  Filled 2019-09-08: qty 15

## 2019-09-08 MED ORDER — LORAZEPAM 2 MG/ML PO CONC
1.0000 mg | ORAL | Status: DC | PRN
  Filled 2019-09-08: qty 1

## 2019-09-08 MED ORDER — METFORMIN HCL 500 MG PO TABS
500.0000 mg | ORAL_TABLET | Freq: Two times a day (BID) | ORAL | Status: DC
  Administered 2019-09-08: 500 mg via ORAL
  Filled 2019-09-08: qty 1

## 2019-09-08 MED ORDER — HALOPERIDOL LACTATE 2 MG/ML PO CONC
0.5000 mg | ORAL | Status: DC | PRN
  Filled 2019-09-08: qty 0.3

## 2019-09-08 MED ORDER — ACETAMINOPHEN 325 MG PO TABS: 650.0000 mg | ORAL_TABLET | Freq: Four times a day (QID) | ORAL | Status: DC | PRN

## 2019-09-08 MED ORDER — FREE WATER
100.0000 mL | Freq: Four times a day (QID) | Status: DC
  Administered 2019-09-08: 100 mL

## 2019-09-08 MED ORDER — HALOPERIDOL 0.5 MG PO TABS: 0.5000 mg | ORAL_TABLET | ORAL | Status: DC | PRN

## 2019-09-08 MED ORDER — LORAZEPAM 1 MG PO TABS: 1.0000 mg | ORAL_TABLET | ORAL | Status: DC | PRN

## 2019-09-08 MED ORDER — GLYCOPYRROLATE 1 MG PO TABS: 1.0000 mg | ORAL_TABLET | ORAL | Status: DC | PRN

## 2019-09-08 MED ORDER — INSULIN ASPART 100 UNIT/ML ~~LOC~~ SOLN
0.0000 [IU] | SUBCUTANEOUS | Status: DC
  Administered 2019-09-08: 10:00:00 5 [IU] via SUBCUTANEOUS
  Administered 2019-09-08: 11 [IU] via SUBCUTANEOUS
  Administered 2019-09-08: 13:00:00 3 [IU] via SUBCUTANEOUS

## 2019-09-08 MED ORDER — INSULIN ASPART 100 UNIT/ML ~~LOC~~ SOLN: 0.0000 [IU] | SUBCUTANEOUS | Status: DC

## 2019-09-08 MED ORDER — ONDANSETRON HCL 4 MG/2ML IJ SOLN: 4.0000 mg | Freq: Four times a day (QID) | INTRAMUSCULAR | Status: DC | PRN

## 2019-09-08 NOTE — Consult Note (Addendum)
Cardiology Consult    Patient ID: [name] [name] MRN: [id number], DOB/AGE: [birth year]   Admit date: 09/04/2019 Date of Consult: 09/08/2019  Primary Physician: Tamsen Roers, MD Primary Cardiologist: New - seen by Elliot Cousin, MD  Requesting Provider: P. Leonie Man, MD  Patient Profile    [name] [name] is a 85 y.o. female with a history of DMII, HTN, HL, GERD, and pancreatitis, who is being seen today for the evaluation of Afib s/p large L MCA territory infarct w/ LICA occlusion at the request of Dr. Leonie Man.  Past Medical History   Past Medical History:  Diagnosis Date  . Cardioembolic stroke ([place])    a. 08/2019 MRI Brain: Large L MCA infarct w/ mild amt of hemorrhage in L putamen. Occluded LICA.  Marland Kitchen Colon polyps   . Diabetes mellitus without complication ([place]) [id number]  . Diastolic dysfunction    a. 08/2019 Echo: EF 60-65%. Gr2 DD. No rwma. Akinesis of the apical RV free wall. Mild BAE. Mild-mod MR. Mild AI. Mod elev PASP. Triv PR.  . Gallstones   . GERD (gastroesophageal reflux disease)   . Hiatal hernia   . HLD (hyperlipidemia)   . Hypertension   . Pancreatitis   . Persistent atrial fibrillation ([place])    a. 08/2019 - dx post L MCA territory CVA. CHA2DS2VASc = 7.  . Skin cancer of face    nose    Past Surgical History:  Procedure Laterality Date  . ABDOMINAL HYSTERECTOMY  1972  . APPENDECTOMY  1972  . BREAST BIOPSY    . carpel tunnel Bilateral ~2010  . CHOLECYSTECTOMY  2008  . cornea trasplant  2007   at [facility]  . ERCP N/A 05/17/2014   Procedure: ENDOSCOPIC RETROGRADE CHOLANGIOPANCREATOGRAPHY (ERCP);  Surgeon: Inda Castle, MD;  Location: [facility];  Service: Endoscopy;  Laterality: N/A;  . HIATAL HERNIA REPAIR  2006  . TONSILLECTOMY       Allergies  No Known Allergies  History of Present Illness    85 year old female with a history of type 2 diabetes mellitus, hypertension, hyperlipidemia GERD, and pancreatitis.  She has no known prior cardiac history.  Per  notes, she was in her usual state of health until the afternoon of December 22, when she went to use the bathroom.  Family went into check on her and noted right-sided weakness with aphasia and facial droop.  She was taken to the emergency department where CT of the head showed no acute abnormality.  She received IV TPA.  CTA of the head and neck showed an acute anterior left MCA territory infarct with large vessel occlusion of the distal left M1 segment and left internal carotid artery occlusion.  MRI showed mild amount of hemorrhage in the left putamen.  Patient has remained globally aphasic with dense hemiplegia.  Echocardiogram showed normal LV function with mild to moderate mitral regurgitation.  On the evening of December 24, she was noted to be tachycardic with rhythm strip showing atrial fibrillation with rapid response and a rate between 130 and 140.  She was initially treated with IV Lopressor and subsequently placed on metoprolol 25 mg twice daily via NG tube.  With this, heart rates have been between 100-130, trending in the 1 teens over the past 6 to 12 hours.  [name] [name] is unable to answer any questions in the setting of ongoing aphasia.  I do note increased work of breathing this morning.  Inpatient Medications    .  stroke: mapping our early  stages of recovery book   Does not apply Once  . atorvastatin  10 mg Per Tube Daily  . chlorhexidine  15 mL Mouth Rinse BID  . Chlorhexidine Gluconate Cloth  6 each Topical Daily  . cycloSPORINE  1 drop Both Eyes BID  . feeding supplement (PRO-STAT SUGAR FREE 64)  30 mL Per Tube Daily  . furosemide  40 mg Intravenous Once  . insulin aspart  0-15 Units Subcutaneous Q4H  . lisinopril  2.5 mg Per Tube Daily  . mouth rinse  15 mL Mouth Rinse q12n4p  . metFORMIN  500 mg Oral BID WC  . metoprolol tartrate  25 mg Per Tube BID  . multivitamin with minerals  1 tablet Oral Daily  . pantoprazole sodium  40 mg Per Tube Daily    Family History - obtained  from [facility] records as patient is not able to provide any family history at this time in the setting of ongoing aphasia.    Family History  Problem Relation Age of Onset  . Hypertension Mother   . Osteoarthritis Mother   . Heart disease Mother   . Colon cancer Sister   . Diabetes Daughter   . Rheum arthritis Son   . Heart disease Maternal Grandfather   . Heart disease Maternal Grandmother    She indicated that the status of her mother is unknown. She indicated that the status of her sister is unknown. She indicated that the status of her maternal grandmother is unknown. She indicated that the status of her maternal grandfather is unknown. She indicated that the status of her daughter is unknown. She indicated that the status of her son is unknown.   Social History-obtained from [facility] records as patient is not able to provide any social history at this time in the setting of ongoing aphasia.    Social History   Socioeconomic History  . Marital status: Widowed    Spouse name: Not on file  . Number of children: 3  . Years of education: Not on file  . Highest education level: Not on file  Occupational History  . Occupation: retired  Tobacco Use  . Smoking status: Former Smoker    Packs/day: 0.50    Years: 1.00    Pack years: 0.50    Types: Cigarettes    Quit date: 05/15/1953    Years since quitting: 66.3  . Smokeless tobacco: Never Used  Substance and Sexual Activity  . Alcohol use: No    Alcohol/week: 0.0 standard drinks  . Drug use: No  . Sexual activity: Not on file  Other Topics Concern  . Not on file  Social History Narrative  . Not on file   Social Determinants of Health   Financial Resource Strain:   . Difficulty of Paying Living Expenses: Not on file  Food Insecurity:   . Worried About Charity fundraiser in the Last Year: Not on file  . Ran Out of Food in the Last Year: Not on file  Transportation Needs:   . Lack of Transportation (Medical): Not on file  . Lack  of Transportation (Non-Medical): Not on file  Physical Activity:   . Days of Exercise per Week: Not on file  . Minutes of Exercise per Session: Not on file  Stress:   . Feeling of Stress : Not on file  Social Connections:   . Frequency of Communication with Friends and Family: Not on file  . Frequency of Social Gatherings with Friends and Family:  Not on file  . Attends Religious Services: Not on file  . Active Member of Clubs or Organizations: Not on file  . Attends Archivist Meetings: Not on file  . Marital Status: Not on file  Intimate Partner Violence:   . Fear of Current or Ex-Partner: Not on file  . Emotionally Abused: Not on file  . Physically Abused: Not on file  . Sexually Abused: Not on file     Review of Systems    Due to aphasia, patient is unable to provide a review of systems.  Physical Exam    Blood pressure (!) 160/88, pulse 100, temperature 98.1 F (36.7 C), temperature source Oral, resp. rate (!) 28, height 5' 2.25" (1.581 m), weight 65.1 kg, SpO2 98 %.  General: Pleasant. Psych: Flat affect. Neuro: Unable to assess orientation secondary to aphasia.  Right-sided hemiplegia with right facial droop.  She moves her left side freely. HEENT: Right facial droop.  Neck: Supple without bruits.  JVD to jaw. Lungs: Increased work of breathing noted.  Markedly diminished breath sounds bilaterally. Heart: Irregularly, irregular, tachycardic, no s3, s4, or murmurs. Abdomen: Soft, non-tender, non-distended, BS + x 4.  Extremities: No clubbing, cyanosis or edema. DP/PT/Radials 1+ and equal bilaterally.  Labs       Lab Results  Component Value Date   WBC 11.6 (H) 09/08/2019   HGB 12.7 09/08/2019   HCT 38.4 09/08/2019   MCV 85.3 09/08/2019   PLT 162 09/08/2019    Recent Labs  Lab 09/04/19 1451 09/08/19 0321  NA 138 138  K 4.0 3.8  CL 105 103  CO2 21* 22  BUN 13 26*  CREATININE 0.76 0.65  CALCIUM 8.9 8.0*  PROT 5.8*  --   BILITOT 0.6  --     ALKPHOS 58  --   ALT 57*  --   AST 33  --   GLUCOSE 129* 302*   Lab Results  Component Value Date   CHOL 91 09/05/2019   HDL 37 (L) 09/05/2019   LDLCALC 41 09/05/2019   TRIG 67 09/05/2019     Radiology Studies    CT Code Stroke CTA Head W/WO contrast  Result Date: 09/04/2019 CLINICAL DATA:  Focal neuro deficit of greater than 6 hours. Stroke suspected. Right-sided facial droop. EXAM: CT ANGIOGRAPHY HEAD AND NECK CT PERFUSION BRAIN TECHNIQUE: Multidetector CT imaging of the head and neck was performed using the standard protocol during bolus administration of intravenous contrast. Multiplanar CT image reconstructions and MIPs were obtained to evaluate the vascular anatomy. Carotid stenosis measurements (when applicable) are obtained utilizing NASCET criteria, using the distal internal carotid diameter as the denominator. Multiphase CT imaging of the brain was performed following IV bolus contrast injection. Subsequent parametric perfusion maps were calculated using RAPID software. CONTRAST:  163mL OMNIPAQUE IOHEXOL 350 MG/ML SOLN COMPARISON:  CT head without contrast 09/04/2019 FINDINGS: CTA NECK FINDINGS Aortic arch: Atherosclerotic calcifications are present at the aortic arch without aneurysm or significant stenosis. Right carotid system: The right common carotid artery is within normal limits. Atherosclerotic changes are noted at the right carotid bifurcation without a significant stenosis relative to the more distal vessel. Mural calcifications are present in the distal cervical right ICA, just below the skull base. Left carotid system: The left common carotid artery is within normal limits. Left ICA is occluded at the bifurcation without significant reconstitution in the neck. Vertebral arteries: The right vertebral artery is the dominant vessel. Both vertebral arteries originate from the subclavian  arteries without significant stenosis. There is no significant stenosis of either vertebral  artery in the neck. Skeleton: Degenerative endplate changes are present cervical spine from C2-3 through C6-7. Osseous foraminal narrowing is greatest at C3-4 and C4-5. No focal lytic or blastic lesions are present. Other neck: The soft tissues the neck are otherwise unremarkable. No focal mucosal or submucosal lesions are present. Salivary glands are within normal limits. Upper chest: Mild dependent atelectasis is present in the right upper lobe. Lung apices are otherwise clear. Thoracic inlet is within normal limits. Review of the MIP images confirms the above findings CTA HEAD FINDINGS Anterior circulation: Atherosclerotic calcifications are present the cavernous right internal carotid artery without a significant stenosis through the ICA terminus. The left internal carotid artery is occluded through the posterior communicating artery. The ICA termini are within normal limits bilaterally. The A1 segments are patent. Anterior communicating artery is patent. Right M1 segment is normal. The left M1 is occluded. Poor distal collaterals are present in the left MCA territory. Right MCA branch vessels demonstrate some distal branch vessel irregularity without a significant proximal stenosis or occlusion. Left-sided collaterals are best posteriorly. Posterior circulation: The right vertebral are artery is dominant above the dural margin. The left V4 segment is hypoplastic compared to the right. PICA origins are visualized and normal. The vertebrobasilar junction is normal. The basilar artery is normal. Both posterior cerebral arteries originate from the basilar tip. Posterior communicating arteries are present bilaterally, left more prominent than right. The PCA branch vessels are within normal limits. Venous sinuses: The dural sinuses are patent. Right transverse sinus is dominant. The straight sinus and deep cerebral veins are intact. Cortical veins are within normal limits. No vascular malformations are present.  Anatomic variants: Prominent posterior communicating arteries bilaterally. The left posterior communicating artery feeds the left ICA in addition to the anterior communicating artery. Review of the MIP images confirms the above findings CT Brain Perfusion Findings: ASPECTS: 10/10 CBF (<30%) Volume: 78mL Perfusion (Tmax>6.0s) volume: 117mL Mismatch Volume: 33mL Infarction Location:Anterior left MCA territory IMPRESSION: 1. Acute anterior left MCA territory infarct. 2. Ischemic penumbra involving the entirety of the posterior portion of the left MCA territory which is partially fed by collaterals. 3. Emergent large vessel occlusion of the distal left M1 segment. 4. 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 5. Atherosclerotic changes at the aortic arch and carotid bifurcations without significant stenosis otherwise. 6. Distal small vessel disease throughout the circle-of-Willis without other significant proximal stenosis, aneurysm, or branch vessel occlusion. 7. Multilevel spondylosis in the cervical spine. These results were called by telephone at the time of interpretation on 09/04/2019 at 3:34 pm to provider [facility] , who verbally acknowledged these results. Electronically Signed   By: San Morelle M.D.   On: 09/04/2019 15:41   CT HEAD WO CONTRAST  Result Date: 09/04/2019 CLINICAL DATA:  Stroke follow-up.  Worsening right-sided weakness EXAM: CT HEAD WITHOUT CONTRAST TECHNIQUE: Contiguous axial images were obtained from the base of the skull through the vertex without intravenous contrast. COMPARISON:  CT head and CTA head 09/04/2019 FINDINGS: Brain: Hypodensity in the left anterior frontal lobe and possibly the left insula shows mild progression. Subtle findings likely to represent acute infarct. No acute intracranial hemorrhage Generalized atrophy. Chronic microvascular ischemic changes in the white  matter. Ventricle size remains normal. Vascular: Hyperdense left MCA has progressed. There is known left internal carotid artery occlusion. Left M1 is  now hyperintense extending into the left M2 branches. Previously the left M1 was patent and did not show hyperdensity on CT. Otherwise no hyperdense vessel. Skull: Negative Sinuses/Orbits: Paranasal sinuses clear. Bilateral cataract surgery. Other: None IMPRESSION: Progression of left MCA hyperdensity compatible with further thrombosis of the left M1 segment extending into the left M2 branches. The left internal carotid artery is occluded on CTA. Possible early signs of infarction in the left frontal white matter and insula. No acute intracranial hemorrhage These results were called by telephone at the time of interpretation on 09/04/2019 at 7:27 pm to provider [facility] , who verbally acknowledged these results. Electronically Signed   By: Franchot Gallo M.D.   On: 09/04/2019 19:28   MR BRAIN WO CONTRAST  Result Date: 09/05/2019 CLINICAL DATA:  Stroke follow-up EXAM: MRI HEAD WITHOUT CONTRAST TECHNIQUE: Multiplanar, multiecho pulse sequences of the brain and surrounding structures were obtained without intravenous contrast. COMPARISON:  CT head 09/04/2019 FINDINGS: Brain: Large territory left MCA acute infarct. Infarct involves left caudate, with partial sparing of left putamen. Extensive infarct throughout the left frontal, temporal, and parietal lobe. Mild amount of hemorrhage in the left caudate. Mild mass-effect and mild midline shift to the right. Mild chronic microvascular ischemic change in the white matter. Chronic microhemorrhage high right parietal lobe and high left frontal lobe. No mass lesion. Negative for hydrocephalus. Vascular: Loss of flow void left internal carotid artery compatible with occlusion. Skull and upper cervical spine: Negative Sinuses/Orbits: Mild mucosal edema paranasal sinuses. Bilateral cataract surgery Other: None  IMPRESSION: Large territory left MCA infarct. Mild amount of hemorrhage left putamen Occlusion left internal carotid artery Electronically Signed   By: Franchot Gallo M.D.   On: 09/05/2019 15:12   DG Abd Portable 1V  Result Date: 09/06/2019 CLINICAL DATA:  NG tube placement. EXAM: PORTABLE ABDOMEN - 1 VIEW COMPARISON:  Radiograph dated 07/14/2017 FINDINGS: NG tube has been inserted and the tip is in the midbody of the stomach. Bowel gas pattern is normal. No acute bone abnormality. Aortic atherosclerosis. IMPRESSION: Benign-appearing abdomen. Nasogastric tube tip is in the midbody of the stomach. Aortic Atherosclerosis (ICD10-I70.0). Electronically Signed   By: Lorriane Shire M.D.   On: 09/06/2019 18:16   CT HEAD CODE STROKE WO CONTRAST  Result Date: 09/04/2019 CLINICAL DATA:  Code stroke.  Right facial droop EXAM: CT HEAD WITHOUT CONTRAST TECHNIQUE: Contiguous axial images were obtained from the base of the skull through the vertex without intravenous contrast. COMPARISON:  None. FINDINGS: Brain: There is no acute intracranial hemorrhage, mass effect, or edema. Gray-white differentiation remains preserved. Patchy hypoattenuation in the supratentorial white matter is nonspecific but probably reflects mild to moderate chronic microvascular ischemic changes. The there is no extra-axial fluid collection. Prominence of the ventricles and sulci reflects minor generalized parenchymal volume loss. Vascular: There is hyperdensity of the left carotid terminus and MCA. Skull: Unremarkable. Sinuses/Orbits: Minor mucosal thickening Other: Mastoid air cells are clear. ASPECTS [facility] Stroke Program Early CT Score) - Ganglionic level infarction (caudate, lentiform nuclei, internal capsule, insula, M1-M3 cortex): 7 - Supraganglionic infarction (M4-M6 cortex): 3 Total score (0-10 with 10 being normal): 10 IMPRESSION: 1. No acute intracranial hemorrhage or evidence of acute infarction. Increased density of left carotid  terminus and MCA suggesting intraluminal thrombus. 2. ASPECTS is 10 These results were communicated to Dr. Leonel Ramsay at 3:05 pmon 12/22/2020by text page via the [facility] messaging system. Electronically Signed   By: Macy Mis M.D.   On: 09/04/2019 15:08  ECG & Cardiac Imaging    12.23.2020 RSR, 87, PACs, prolonged QT - personally reviewed. Tele: Patient was placed on telemetry on the evening of December 24 and was noted to be in atrial fibrillation with rapid response and rates initially in the 130s to 140s.  She is currently in the low 100s to 1 teens- personally reviewed.  Assessment & Plan    1.  Left MCA territory cardioembolic stroke: Patient presented December 22 with right-sided weakness, facial droop, and aphasia.  She was treated with TPA.  She was found to have an acute anterior left MCA territory infarct with large vessel occlusion of the distal left M1 segment and left internal carotid artery occlusion.  MRI showed mild amount of hemorrhage in the left putamen.  Echo showed normal LV function.  Patient has remained aphasic with hemiplegia and is now status post NG tube placement.  She developed atrial fibrillation on the evening of December 24 and is currently being rate controlled with beta-blocker therapy.  In the setting of left putamen hemorrhage, she is not currently a candidate for oral anticoagulation and is currently on aspirin.  Per neurology, plan for Eliquis in approximately 1 week.  2.  Persistent atrial fibrillation with rapid ventricular response: As above, patient noted to have A. fib with RVR on the evening of December 24.  She is currently on metoprolol 25 mg twice daily with heart rates now trending in the low 100s-1 teens.  Blood pressures have been trending in the 140s to 160s.  I Liahm Grivas titrate metoprolol to 50 twice daily.  Echocardiogram showed normal LV function but did show grade 2 diastolic dysfunction.  She is currently dyspneic at rest with use of accessory  muscles.  I suspect, persistent and rapid A. fib is contributing to worsening diastolic dysfunction and volume overload.  I am going to stop her IV fluids and have written for a chest x-ray and a dose of IV Lasix.  As above, plan for oral anticoagulation in approximately 1 week as she is not currently a candidate due to small amount of hemorrhage noted on MRI.  As we cannot currently anticoagulate, she is not an ideal candidate for cardioversion or antiarrhythmic therapy at this time.   3.  Acute diastolic congestive heart failure: Patient with increased work of breathing this morning.  JVD and markedly diminished breath sounds noted on examination.  She has been receiving saline at 50 cc/h and is net +2 L since admission.  As above, I suspect rapid A. fib is contributing to worsening of diastolic dysfunction, leading to some degree of volume overload.  Lasix 40 IV x1 ordered along with chest x-ray.  Saline discontinued.  She may require additional diuresis.  4.  Essential hypertension: Pressures 140s to 160s.  This is at post stroke goal of less than 180.  Titrating metoprolol primarily for improved rate control.  5.  Hyperlipidemia: LDL 41.  She remains on statin therapy.  6.  Type 2 diabetes mellitus: A1c 6.1 on metformin.  Signed, Murray Hodgkins, NP 09/08/2019, 8:21 AM  For questions or updates, please contact   Please consult [email or web address] for contact info under Cardiology/STEMI.  I have seen and examined this patient with Ignacia Bayley.  Agree with above, note added to reflect my findings.  On exam, iRRR, no murmurs, crackles, JVD.  Patient mid to the hospital with cryptogenic stroke.  She has since gone into atrial fibrillation.  She is currently aphasic and thus no interview was able to  be performed.  She does have evidence of JVD and is net +2 L.  Her atrial fibrillation has been rapid.  Would hold off on anticoagulation until cleared by neurology as she does have some putamen hemorrhage  after TPA.  We Symiah Nowotny plan for diuresis with Lasix today.  We Jasaun Carn also increase her rate control with 50 mg twice daily of metoprolol.  She Shayden Gingrich need anticoagulation in the future for further stroke prevention.  Her CHA2DS2-VASc is 6.  Alakai Macbride M. Britanee Vanblarcom MD 09/08/2019 9:12 AM

## 2019-09-08 NOTE — Progress Notes (Signed)
STROKE TEAM PROGRESS NOTE   INTERVAL HISTORY Patient RN is at bedside. Pt lying in bed, global aphasia and right hemiplegia. On NG tube with TF. Will put on some free water flush. Cardiology on board, gave diuresis. CT showed left putamen HT, on ASA will hold off AC at this time.   OBJECTIVE Vitals:   09/07/19 1236 09/07/19 1951 09/08/19 0007 09/08/19 0359  BP: (!) 147/79 (!) 149/86 (!) 152/83 (!) 146/95  Pulse: (!) 106 (!) 115 98 (!) 130  Resp: 18 17    Temp: 98.7 F (37.1 C) 99.1 F (37.3 C) 98.5 F (36.9 C) 97.8 F (36.6 C)  TempSrc: Axillary Oral Oral Axillary  SpO2: 99% 98% 93% 97%  Weight:      Height:       CBC:  Recent Labs  Lab 09/04/19 1451 09/04/19 1459 09/08/19 0321  WBC 8.8  --  11.6*  NEUTROABS 6.7  --   --   HGB 11.0* 10.9* 12.7  HCT 34.6* 32.0* 38.4  MCV 88.7  --  85.3  PLT 112*  --  0000000   Basic Metabolic Panel:  Recent Labs  Lab 09/04/19 1451 09/04/19 1459 09/08/19 0321  NA 138 139 138  K 4.0 4.0 3.8  CL 105 103 103  CO2 21*  --  22  GLUCOSE 129* 126* 302*  BUN 13 14 26*  CREATININE 0.76 0.70 0.65  CALCIUM 8.9  --  8.0*   Lipid Panel:     Component Value Date/Time   CHOL 91 09/05/2019 0522   TRIG 67 09/05/2019 0522   HDL 37 (L) 09/05/2019 0522   CHOLHDL 2.5 09/05/2019 0522   VLDL 13 09/05/2019 0522   LDLCALC 41 09/05/2019 0522   HgbA1c:  Lab Results  Component Value Date   HGBA1C 6.1 (H) 09/05/2019   IMAGING DG Abd Portable 1V  Result Date: 09/06/2019 CLINICAL DATA:  NG tube placement. EXAM: PORTABLE ABDOMEN - 1 VIEW COMPARISON:  Radiograph dated 07/14/2017 FINDINGS: NG tube has been inserted and the tip is in the midbody of the stomach. Bowel gas pattern is normal. No acute bone abnormality. Aortic atherosclerosis. IMPRESSION: Benign-appearing abdomen. Nasogastric tube tip is in the midbody of the stomach. Aortic Atherosclerosis (ICD10-I70.0). Electronically Signed   By: Lorriane Shire M.D.   On: 09/06/2019 18:16   DG Swallowing  Func-Speech Pathology  Result Date: 09/06/2019 Objective Swallowing Evaluation: Type of Study: MBS-Modified Barium Swallow Study  Patient Details Name: [name] [name] MRN: [id number] Date of Birth: 1926-05-08 Today's Date: 09/06/2019 Time: SLP Start Time (ACUTE ONLY): 1201 -SLP Stop Time (ACUTE ONLY): 1213 SLP Time Calculation (min) (ACUTE ONLY): 12 min Past Medical History: Past Medical History: Diagnosis Date . Colon polyps  . Diabetes mellitus without complication ([place]) [id number] . Gallstones  . GERD (gastroesophageal reflux disease)  . Hiatal hernia  . HLD (hyperlipidemia)  . Hypertension  . Pancreatitis  . Skin cancer of face   nose Past Surgical History: Past Surgical History: Procedure Laterality Date . ABDOMINAL HYSTERECTOMY  1972 . APPENDECTOMY  1972 . BREAST BIOPSY   . carpel tunnel Bilateral ~2010 . CHOLECYSTECTOMY  2008 . cornea trasplant  2007  at [facility] . ERCP N/A 05/17/2014  Procedure: ENDOSCOPIC RETROGRADE CHOLANGIOPANCREATOGRAPHY (ERCP);  Surgeon: Inda Castle, MD;  Location: [facility];  Service: Endoscopy;  Laterality: N/A; . HIATAL HERNIA REPAIR  2006 . TONSILLECTOMY   HPI: 85yo female admitted 09/04/2019 with right weakness, facial droop, aphasia. TPA given. PMH: HTN, HLD, DM2, GERD, pancreatitis.  CTAhead/neck - LMCA occlusion. MRI 12/23 revealed large L MCA infarct including subcortical structures. "Large territory left MCA acute infarct. Infarct involves left caudate, with partial sparing of left putamen. Extensive infarct throughout the left frontal, temporal, and parietal lobe. Mild amount of hemorrhage in the left caudate. Mild mass-effect and mild midline shift to the right."  Subjective: Pt awake, alert Assessment / Plan / Recommendation CHL IP CLINICAL IMPRESSIONS 09/06/2019 Clinical Impression Pt presents with moderate-severe oropharyngeal dysphagia c/b decreased lingual strength and coordination, reduced labial seal, delayed swallow initiation, decreased pharyngeal constriction, reduced  base of tongue retraction, incomplete laryngeal closure, decreased epiglottic inversion, reduced laryngeal elevation and excursion, and diminished sensation.  These deficts resulted in aspiration of nectar thick liquid by cup and spoon.  Pt exhibited cough response, but was unable to fully clear penetration/aspiration. With honey thick liquid by cup and spoon there was intermittent trace, transient penetration, but no aspiration.  There was no penetration or aspiration with puree.  There was however, significant oral and pharyngeal residue with both honey thick liquid and puree consistencies requiring multiple cleansing swallows to reduce residue, which still did not fully clear.  Although pt appears to be protecting airway during the swallow, there is a risk of aspiration given significant amount of residue in pharynx.  Pt may have puree snacks and honey thick liquids with direct supervision.  Pt is unlikely to be able to meet nutritional needs orally and may not be safe for a full oral diet at this time given amount of pharyngeal residue and difficulty with clearance.  Recommend alternate means of nutrition for nutritional support.  Pt is not a good candidate for swallowing therapy at this time 2/2 cognitive impairments.  SLP will follow for diet tolerance and readiness for advancement. SLP Visit Diagnosis Dysphagia, oropharyngeal phase (R13.12) Attention and concentration deficit following -- Frontal lobe and executive function deficit following -- Impact on safety and function Moderate aspiration risk   CHL IP TREATMENT RECOMMENDATION 09/06/2019 Treatment Recommendations Therapy as outlined in treatment plan below   Prognosis 09/06/2019 Prognosis for Safe Diet Advancement Fair Barriers to Reach Goals -- Barriers/Prognosis Comment -- CHL IP DIET RECOMMENDATION 09/06/2019 SLP Diet Recommendations Honey thick liquids;Dysphagia 1 (Puree) solids Liquid Administration via Cup Medication Administration Via alternative  means Compensations Slow rate;Small sips/bites;Multiple dry swallows after each bite/sip Postural Changes Seated upright at 90 degrees   CHL IP OTHER RECOMMENDATIONS 09/06/2019 Recommended Consults -- Oral Care Recommendations Oral care QID Other Recommendations --   CHL IP FOLLOW UP RECOMMENDATIONS 09/06/2019 Follow up Recommendations (No Data)   CHL IP FREQUENCY AND DURATION 09/06/2019 Speech Therapy Frequency (ACUTE ONLY) min 2x/week Treatment Duration 2 weeks      CHL IP ORAL PHASE 09/06/2019 Oral Phase Impaired Oral - Pudding Teaspoon -- Oral - Pudding Cup -- Oral - Honey Teaspoon Right anterior bolus loss;Piecemeal swallowing;Lingual/palatal residue;Premature spillage Oral - Honey Cup Right anterior bolus loss;Piecemeal swallowing;Lingual/palatal residue;Premature spillage Oral - Nectar Teaspoon Right anterior bolus loss;Lingual/palatal residue;Premature spillage Oral - Nectar Cup Right anterior bolus loss Oral - Nectar Straw -- Oral - Thin Teaspoon -- Oral - Thin Cup -- Oral - Thin Straw -- Oral - Puree Reduced posterior propulsion;Lingual pumping;Piecemeal swallowing;Lingual/palatal residue;Decreased bolus cohesion;Premature spillage Oral - Mech Soft -- Oral - Regular -- Oral - Multi-Consistency -- Oral - Pill -- Oral Phase - Comment --  CHL IP PHARYNGEAL PHASE 09/06/2019 Pharyngeal Phase Impaired Pharyngeal- Pudding Teaspoon -- Pharyngeal -- Pharyngeal- Pudding Cup -- Pharyngeal -- Pharyngeal- Honey Teaspoon Delayed  swallow initiation-vallecula;Reduced epiglottic inversion;Reduced laryngeal elevation;Reduced airway/laryngeal closure;Reduced tongue base retraction;Pharyngeal residue - valleculae;Pharyngeal residue - pyriform;Pharyngeal residue - posterior pharnyx;Pharyngeal residue - cp segment Pharyngeal Material does not enter airway Pharyngeal- Honey Cup Delayed swallow initiation-vallecula;Reduced epiglottic inversion;Reduced laryngeal elevation;Reduced airway/laryngeal closure;Reduced tongue base  retraction;Pharyngeal residue - valleculae;Pharyngeal residue - pyriform;Pharyngeal residue - posterior pharnyx;Pharyngeal residue - cp segment Pharyngeal Material does not enter airway;Material enters airway, remains ABOVE vocal cords then ejected out Pharyngeal- Nectar Teaspoon Delayed swallow initiation-vallecula;Reduced epiglottic inversion;Reduced anterior laryngeal mobility;Reduced laryngeal elevation;Reduced airway/laryngeal closure;Reduced tongue base retraction;Penetration/Aspiration during swallow;Penetration/Aspiration before swallow;Penetration/Apiration after swallow;Moderate aspiration;Pharyngeal residue - valleculae;Pharyngeal residue - pyriform;Pharyngeal residue - posterior pharnyx;Pharyngeal residue - cp segment Pharyngeal Material enters airway, passes BELOW cords without attempt by patient to eject out (silent aspiration);Material enters airway, passes BELOW cords and not ejected out despite cough attempt by patient Pharyngeal- Nectar Cup Delayed swallow initiation-pyriform sinuses;Reduced epiglottic inversion;Reduced anterior laryngeal mobility;Reduced laryngeal elevation;Reduced airway/laryngeal closure;Reduced tongue base retraction;Penetration/Aspiration during swallow;Penetration/Apiration after swallow;Penetration/Aspiration before swallow;Moderate aspiration;Pharyngeal residue - valleculae;Pharyngeal residue - pyriform;Pharyngeal residue - posterior pharnyx;Pharyngeal residue - cp segment Pharyngeal Material enters airway, passes BELOW cords and not ejected out despite cough attempt by patient Pharyngeal- Nectar Straw -- Pharyngeal -- Pharyngeal- Thin Teaspoon -- Pharyngeal -- Pharyngeal- Thin Cup -- Pharyngeal -- Pharyngeal- Thin Straw -- Pharyngeal -- Pharyngeal- Puree Delayed swallow initiation-vallecula;Reduced pharyngeal peristalsis;Reduced epiglottic inversion;Reduced anterior laryngeal mobility;Reduced laryngeal elevation;Reduced airway/laryngeal closure;Reduced tongue base  retraction;Pharyngeal residue - valleculae;Pharyngeal residue - posterior pharnyx;Pharyngeal residue - pyriform Pharyngeal Material does not enter airway Pharyngeal- Mechanical Soft -- Pharyngeal -- Pharyngeal- Regular -- Pharyngeal -- Pharyngeal- Multi-consistency -- Pharyngeal -- Pharyngeal- Pill -- Pharyngeal -- Pharyngeal Comment --  CHL IP CERVICAL ESOPHAGEAL PHASE 09/06/2019 Cervical Esophageal Phase WFL Pudding Teaspoon -- Pudding Cup -- Honey Teaspoon -- Honey Cup -- Nectar Teaspoon -- Nectar Cup -- Nectar Straw -- Thin Teaspoon -- Thin Cup -- Thin Straw -- Puree -- Mechanical Soft -- Regular -- Multi-consistency -- Pill -- Cervical Esophageal Comment -- Celedonio Savage, MA, CCC-SLP Acute Rehabilitation Services Office: [phone] 09/06/2019, 12:54 PM              ECG - SR rate  BPM. (See cardiology reading for complete details)  PHYSICAL EXAM      Temp:  [97.8 F (36.6 C)-99.1 F (37.3 C)] 98.8 F (37.1 C) (12/26 1148) Pulse Rate:  [86-130] 87 (12/26 1148) Resp:  [17-28] 22 (12/26 1148) BP: (141-160)/(77-111) 141/77 (12/26 1148) SpO2:  [93 %-98 %] 96 % (12/26 1148)  General - Well nourished, well developed, in no apparent distress.  Ophthalmologic - fundi not visualized due to noncooperation.  Cardiovascular - irregularly irregular heart rate and rhythm.  Neuro - awake, lethargic, eyes open, global aphasia, not following commands. Left gaze preference, barely cross midline, right neglect. Blinking to visual threat on the left but not on the right. Right facial droop. NG tube in place. Tongue protrusion not cooperative. PERRL. LUE purposeful movement, at least 3-4/5. RUE flaccid. LLE spontaneous movement at least 2/5. RLE slight withdraw to pain stimulation. Right babinski positive. Sensation, coordination and gait not tested.   ASSESSMENT/PLAN [name] [name] is a 85 y.o. female who presented with L MCA syndrome. She was treated with IV Alteplase 09/04/2019 at 1511, but  unfortunately, not a candidate for thrombectomy d/t significant infarct area on CTP.   Stroke: large cardieoemboic LMCA infarct s/p tPA with mild hemorrhage.  Etiology left ICA occlusion from new diagnosed atrial fibrillation   CT Head -  ASPECTS 10  CTA H&N - L MCA oculsion  CT Perfusion- large core with also large penumbra  MRI head- large L MCA infarct, mild hemorrhage L putamen   2D Echo - EF 60-65%. No source of embolus. B atrium dilated   [employer] Virus 2  neg  LDL - 41  HgbA1c - 6.1  VTE prophylaxis - Lovenox    81mg  ASA prior to admission, no antithrombotic now secondary to post tPA hemorrhage (mild)   Therapy recommendations: SNF  Disposition:  Pending  DNR  Atrial Fibrillation w/ RVR, new onset 12/24  CHA2DS2-VASc Score = 7, ?2 oral anticoagulation recommended  Age in Years:  ?89   +2    Sex:  Female   Female   +1    Hypertension History:  yes   +1     Diabetes Mellitus:  yes   +1  Congestive Heart Failure History:  0  Vascular Disease History:  0     Stroke/TIA/Thromboembolism History:  yes   +2 . Metoprolol 25 bid added . TSH normal range . Cardiology consulted for new AF . Not an AC candidate d/t post tPA hemorrhage (mild) - Likely can start at d/c   Hypertension  Home BP meds: zestril, metoprolol 25 daily  Current BP meds: metoprolol 25 bid w/ new onset AF  Stable . SBP < 180    . Resume zestril per tube.  . Long-term BP goal normotensive  Hyperlipidemia  Home Lipid lowering medication: lipitor 10mg , lovaza  LDL 41, goal < 70  Current lipid lowering medication:  lipitor 10 mg  Will not use higher intensity statin secondary to advanced age  Continue statin at discharge   Diabetes  Home diabetic meds: glucophage  Current diabetic meds: resume metformine  HgbA1c 6.1, goal < 7.0  SSI  CBG monitoring  Dysphagia . Secondary to stroke . On D1 honey thick liquids w/ supervision only  . NG placed w/ TF running at 50  . Meds  via tube . Speech on board  Other Stroke Risk Factors  Advanced age  Other Active Problems  Dry eye on restasis  Hospital day # 4  I spent  35 minutes in total face-to-face time with the patient, more than 50% of which was spent in counseling and coordination of care, reviewing test results, images and medication, and discussing the diagnosis of large MCA infarct, new diagnosed afib, uncontrolled DM, treatment plan and potential prognosis. This patient's care requiresreview of multiple databases, neurological assessment, discussion with family, other specialists and medical decision making of high complexity.  Rosalin Hawking, MD PhD Stroke Neurology 09/08/2019 10:47 PM   To contact Stroke Continuity provider, please refer to [email or web address] After hours, contact General Neurology

## 2019-09-08 NOTE — Plan of Care (Signed)
Patient daughter at beside. She has been explained about the diagnosis, current plan and poor prognosis. As per daughter, she has expressed in the living will that if she suffers a devastating injury with no chance of meaningful recovery and is in a terminal condition then wanted end of care. Daughter stated that she has discussed with her brothers and has decided to honor pt wishes. We will initiate comfort care measures and follow up with SW for [facility] transfer once bed available.  Rosalin Hawking, MD PhD Stroke Neurology 09/08/2019 10:51 PM

## 2019-09-09 DIAGNOSIS — I4821 Permanent atrial fibrillation: Secondary | ICD-10-CM

## 2019-09-09 NOTE — Progress Notes (Signed)
STROKE TEAM PROGRESS NOTE   INTERVAL HISTORY Patient daughter and granddaughter are at bedside. Pt lying in bed, drowsy, but eyes open with voice. Global aphasia and right hemiplegia. Family requesting home hospice vs. [facility].   OBJECTIVE Vitals:   09/08/19 1127 09/08/19 1148 09/08/19 2027 09/09/19 1003  BP:  (!) 141/77 (!) 164/79 (!) 127/107  Pulse: 97 87 61 62  Resp:  (!) 22 20 20   Temp:  98.8 F (37.1 C) 99.1 F (37.3 C) 98.1 F (36.7 C)  TempSrc:  Oral Axillary Oral  SpO2:  96% 96% 98%  Weight:      Height:       CBC:  Recent Labs  Lab 09/04/19 1451 09/04/19 1459 09/08/19 0321  WBC 8.8  --  11.6*  NEUTROABS 6.7  --   --   HGB 11.0* 10.9* 12.7  HCT 34.6* 32.0* 38.4  MCV 88.7  --  85.3  PLT 112*  --  0000000   Basic Metabolic Panel:  Recent Labs  Lab 09/04/19 1451 09/04/19 1459 09/08/19 0321  NA 138 139 138  K 4.0 4.0 3.8  CL 105 103 103  CO2 21*  --  22  GLUCOSE 129* 126* 302*  BUN 13 14 26*  CREATININE 0.76 0.70 0.65  CALCIUM 8.9  --  8.0*   Lipid Panel:     Component Value Date/Time   CHOL 91 09/05/2019 0522   TRIG 67 09/05/2019 0522   HDL 37 (L) 09/05/2019 0522   CHOLHDL [name].5 09/05/2019 0522   VLDL 13 09/05/2019 0522   LDLCALC 41 09/05/2019 0522   HgbA1c:  Lab Results  Component Value Date   HGBA1C 6.1 (H) 09/05/2019   IMAGING DG CHEST PORT 1 VIEW  Result Date: 09/08/2019 CLINICAL DATA:  85 year old female respiratory distress. Recent large left MCA infarct. EXAM: PORTABLE CHEST 1 VIEW COMPARISON:  Chest radiographs 02/16/2017 and earlier. FINDINGS: Portable AP semi upright view at 0931 hours. Enteric feeding tube in place with side hole at the level of the proximal stomach. Lung volumes and mediastinal contours are stable since 2018. Allowing for portable technique the lungs are clear. Stable cholecystectomy clips. No acute osseous abnormality identified. IMPRESSION: 1. Enteric tube in place, side hole at the level of the stomach. [name].  No  acute cardiopulmonary abnormality. Electronically Signed   By: Genevie Ann M.D.   On: 09/08/2019 09:40   ECG - SR rate  BPM. (See cardiology reading for complete details)  PHYSICAL EXAM      Temp:  [98.1 F (36.7 C)-99.1 F (37.3 C)] 98.1 F (36.7 C) (12/27 1003) Pulse Rate:  [61-62] 62 (12/27 1003) Resp:  [20] 20 (12/27 1003) BP: (127-164)/(79-107) 127/107 (12/27 1003) SpO2:  [96 %-98 %] 98 % (12/27 1003)  General - Well nourished, well developed, in no apparent distress.  Ophthalmologic - fundi not visualized due to comfort care measures.  Cardiovascular - irregularly irregular heart rate and rhythm.  Neuro - limited due to comfort care measures. Drowsy but eyes open on voice, global aphasia, not following commands. Left gaze preference, barely cross midline, right neglect. Blinking to visual threat on the left but not on the right. Right facial droop. LUE and LLE some purposeful movement. RUE and RLE no movement. Sensation, coordination and gait not tested.   ASSESSMENT/PLAN [name] [name] is a 85 y.o. female who presented with L MCA syndrome. She was treated with IV Alteplase 09/04/2019 at 1511, but unfortunately, not a candidate for thrombectomy d/t  significant infarct area on CTP.   Stroke: large cardieoemboic LMCA infarct s/p tPA with mild hemorrhage.  Etiology left ICA occlusion from new diagnosed atrial fibrillation   CT Head -    ASPECTS 10  CTA H&N - L MCA oculsion  CT Perfusion- large core with also large penumbra  MRI head- large L MCA infarct, mild hemorrhage L putamen   2D Echo - EF 60-65%. No source of embolus. B atrium dilated   [name] [name]  neg  LDL - 41  HgbA1c - 6.1  VTE prophylaxis - Lovenox    81mg  ASA prior to admission   DNR on comfort care measures  Family requested home hospice vs. [facility]  Atrial Fibrillation w/ RVR, new onset 12/24  CHA2DS2-VASc Score = 7, ?[name] oral anticoagulation recommended  Age in Years:  ?49   +[name]     Sex:  Female   Female   +1    Hypertension History:  yes   +1     Diabetes Mellitus:  yes   +1  Congestive Heart Failure History:  0  Vascular Disease History:  0     Stroke/TIA/Thromboembolism History:  yes   +[name] . TSH normal range . On comfort care measures   Hypertension  Home BP meds: zestril, metoprolol 25 daily  Stable  No BP meds for comfort care measures  Hyperlipidemia  Home Lipid lowering medication: lipitor 10mg , lovaza  LDL 41, goal < 70  Diabetes  Home diabetic meds: glucophage  HgbA1c 6.1  Dysphagia . Secondary to stroke . NPO for comfort care measures  Other Stroke Risk Factors  Advanced age  Other Active Problems    [name] Hawking, MD PhD Stroke Neurology 09/09/2019 5:18 PM   To contact Stroke Continuity provider, please refer to [email or web address] After hours, contact General Neurology

## 2019-09-09 NOTE — Care Management (Signed)
Spoke w Anderson Malta RN [facility] hospice liaison to notify her of change of plan for patient to go home with hospice care tomorrow. MD aware they will need to prpvide scripts. Anderson Malta in contact with family about start of care and DME needs. Medical necessity form initiated.

## 2019-09-09 NOTE — Progress Notes (Signed)
Geneticist, molecular received referral for pt to transfer to [facility] for EOL care. There are currently no available beds at [facility].   Writer spoke with pt's daughter, Holley Raring, who stated that she and the family are deciding between bringing the pt home under hospice care vs [facility]. Writer explained to daughter that [facility] is happy to support them with whichever decision is made and advised to call [facility] back if a decision is made today. Otherwise, liaison will follow up with [facility] tomorrow morning.   Please outreach [facility] with any needs and thank you for the referral.   Freddie Breech, RN [facility] Liaison [phone]

## 2019-09-09 NOTE — Progress Notes (Signed)
Patient has changed goals of care to comfort measures. Cardiology to sign off.  Allegra Lai, MD

## 2019-09-09 NOTE — TOC Initial Note (Addendum)
Transition of Care [facility]) - Initial/Assessment Note    Patient Details  Name: [name] [name] MRN: [id number] Date of Birth: [birth year]  Transition of Care [facility]) CM/SW Contact:    Gelene Mink, [place] Phone Number: 09/09/2019, 10:52 AM  Clinical Narrative:               Update 10:56am:   CSW received a return phone call from [place] with [facility]. She took the referral and plans on reaching out to the family. She will make sure that the patient meets hospice requirements. She will contact the CSW when she has been approved and a bed becomes available.   __________________________________  CSW received message from PA that the family would like to pursue residential hospice at [facility].   CSW called Bradd Canary, [facility] liaison to make the referral. CSW left a message asking for a return phone call. CSW will make referral once she speaks with Delsa Sale.   CSW will continue to follow and assist with disposition planning.   Expected Discharge Plan: [place] Barriers to Discharge: Continued Medical Work up, Hospice Bed not available   Patient Goals and CMS Choice Patient states their goals for this hospitalization and ongoing recovery are:: Family wants patient to transition to comfort care CMS Medicare.gov Compare Post Acute Care list provided to:: Patient Represenative (must comment) Choice offered to / list presented to : Adult Children  Expected Discharge Plan and Services Expected Discharge Plan: [place] In-house Referral: Clinical Social Work   Post Acute Care Choice: Hospice Living arrangements for the past 2 months: [place]                                      Prior Living Arrangements/Services Living arrangements for the past 2 months: Single Family Home Lives with:: Adult Children Patient language and need for interpreter reviewed:: No Do you feel safe going back to the place where you live?: Yes      Need for  Family Participation in Patient Care: Yes (Comment) Care giver support system in place?: Yes (comment)   Criminal Activity/Legal Involvement Pertinent to Current Situation/Hospitalization: No - Comment as needed  Activities of Daily Living      Permission Sought/Granted Permission sought to share information with : Family Supports, Case Manager Permission granted to share information with : Yes, Verbal Permission Granted     Permission granted to share info w AGENCY: [facility]        Emotional Assessment              Admission diagnosis:  Stroke (cerebrum) ([place]) [I63.9] Cerebrovascular accident (CVA), unspecified mechanism ([place]) [I63.9] Patient Active Problem List   Diagnosis Date Noted  . Aphasia due to old cerebral infarction 09/06/2019  . Dysphagia 09/06/2019  . Stroke (cerebrum) ([place]) 09/04/2019  . Malnutrition ([place]) 05/18/2014  . Pulmonary nodule 05/17/2014  . Calculus of bile duct without mention of cholecystitis or obstruction 05/17/2014  . Epigastric pain 05/15/2014  . Transaminitis 05/15/2014  . CKD (chronic kidney disease) stage 2, GFR 60-89 ml/min 05/15/2014  . DM type 2 (diabetes mellitus, type 2) ([place]) 01/03/2008  . HYPERTENSION 01/03/2008  . GERD 01/03/2008  . HIATAL HERNIA 09/28/2005   PCP:  Tamsen Roers, MD Pharmacy:   [facility] [address] [place] [address] Phone: [phone] Fax: [phone]  [facility]  [address] [place], [place]  [address] Phone: [phone] Fax: [phone]     Social Determinants of Health (SDOH) Interventions    Readmission Risk Interventions No flowsheet data found.

## 2019-09-10 DIAGNOSIS — I4891 Unspecified atrial fibrillation: Secondary | ICD-10-CM | POA: Diagnosis present

## 2019-09-10 DIAGNOSIS — E785 Hyperlipidemia, unspecified: Secondary | ICD-10-CM | POA: Diagnosis present

## 2019-09-10 MED ORDER — WHITE PETROLATUM EX OINT
TOPICAL_OINTMENT | CUTANEOUS | Status: AC
  Filled 2019-09-10: qty 28.35

## 2019-09-10 NOTE — Progress Notes (Signed)
STROKE TEAM PROGRESS NOTE   INTERVAL HISTORY No family at bedside.  Patient sleepy but open eyes on voice, no discomfort, still has global aphasia and right hemiplegia.  [facility]ending [facility] versus home hospice.  OBJECTIVE Vitals:   09/08/19 1148 09/08/19 2027 09/09/19 1003 09/09/19 1947  BP: (!) 141/77 (!) 164/79 (!) 127/107 (!) 145/104  Pulse: 87 61 62 90  Resp: (!) 22 20 20 16   Temp: 98.8 F (37.1 C) 99.1 F (37.3 C) 98.1 F (36.7 C) 97.8 F (36.6 C)  TempSrc: Oral Axillary Oral Oral  SpO2: 96% 96% 98% 98%  Weight:      Height:       CBC:  Recent Labs  Lab 09/04/19 1451 09/04/19 1459 09/08/19 0321  WBC 8.8  --  11.6*  NEUTROABS 6.7  --   --   HGB 11.0* 10.9* 12.7  HCT 34.6* 32.0* 38.4  MCV 88.7  --  85.3  PLT 112*  --  0000000   Basic Metabolic Panel:  Recent Labs  Lab 09/04/19 1451 09/04/19 1459 09/08/19 0321  NA 138 139 138  K 4.0 4.0 3.8  CL 105 103 103  CO2 21*  --  22  GLUCOSE 129* 126* 302*  BUN 13 14 26*  CREATININE 0.76 0.70 0.65  CALCIUM 8.9  --  8.0*   Lipid Panel:     Component Value Date/Time   CHOL 91 09/05/2019 0522   TRIG 67 09/05/2019 0522   HDL 37 (L) 09/05/2019 0522   CHOLHDL 2.5 09/05/2019 0522   VLDL 13 09/05/2019 0522   LDLCALC 41 09/05/2019 0522   HgbA1c:  Lab Results  Component Value Date   HGBA1C 6.1 (H) 09/05/2019   IMAGING past 24h No results found.   PHYSICAL EXAM      Temp:  [97.8 F (36.6 C)] 97.8 F (36.6 C) (12/27 1947) Pulse Rate:  [90] 90 (12/27 1947) Resp:  [16] 16 (12/27 1947) BP: (145)/(104) 145/104 (12/27 1947) SpO2:  [98 %] 98 % (12/27 1947)  General - Well nourished, well developed, in no apparent distress.  Ophthalmologic - fundi not visualized due to comfort care measures.  Cardiovascular - irregularly irregular heart rate and rhythm.  Neuro - limited due to comfort care measures. Drowsy but eyes open on voice, global aphasia, not following commands. Left gaze preference, barely cross midline,  right neglect. Blinking to visual threat on the left but not on the right. Right facial droop. LUE and LLE some purposeful movement. RUE and RLE no movement. Sensation, coordination and gait not tested.   ASSESSMENT/PLAN Ms. JADLYN CREAGH is a 85 y.o. female who presented with L MCA syndrome. She was treated with IV Alteplase 09/04/2019 at 1511, but unfortunately, not a candidate for thrombectomy d/t significant infarct area on CTP.   Stroke: large cardieoemboic LMCA infarct s/p tPA with mild hemorrhage.  Etiology left ICA occlusion from new diagnosed atrial fibrillation   CT Head -    ASPECTS 10  CTA H&N - L MCA oculsion  CT Perfusion- large core with also large penumbra  MRI head- large L MCA infarct, mild hemorrhage L putamen   2D Echo - EF 60-65%. No source of embolus. B atrium dilated   [employer] Virus 2  neg  LDL - 41  HgbA1c - 6.1  VTE prophylaxis - Lovenox    81mg  ASA prior to admission   DNR on comfort care measures  Family yet to decide on home hospice vs. [facility]  RN following with family for decision  Atrial Fibrillation w/ RVR, new onset 12/24  CHA2DS2-VASc Score = 7, ?2 oral anticoagulation recommended  Age in Years:  ?27   +2    Sex:  Female   Female   +1    Hypertension History:  yes   +1     Diabetes Mellitus:  yes   +1  Congestive Heart Failure History:  0  Vascular Disease History:  0     Stroke/TIA/Thromboembolism History:  yes   +2 . TSH normal range . On comfort care measures   Hypertension  Home BP meds: zestril, metoprolol 25 daily  Stable  No BP meds for comfort care measures  Hyperlipidemia  Home Lipid lowering medication: lipitor 10mg , lovaza  LDL 41, goal < 70  No statin given comfort measures  Diabetes  Home diabetic meds: glucophage  HgbA1c 6.1  Dysphagia . Secondary to stroke . NPO for comfort care measures  Other Stroke Risk Factors  Advanced age  Hospital day #6   Rosalin Hawking, MD PhD Stroke  Neurology 09/10/2019 2:38 PM   To contact Stroke Continuity provider, please refer to [email or web address] After hours, contact General Neurology

## 2019-09-10 NOTE — Progress Notes (Addendum)
Manufacturing engineer [facility])  Spoke with daughter and [facility] regarding hospice services.  Discussed residential services vs home with hospice.  Family is concerned with visitation at residential hospice. Dtr to discuss with family and will return call with decision.  Venia Carbon RN, BSN, [facility] Liaison (in [place]) [phone]

## 2019-09-10 NOTE — Discharge Summary (Addendum)
Stroke Discharge Summary  Patient ID: estalene kalu   MRN: [id number]      DOB: [birth year]  Date of Admission: 09/04/2019 Date of Discharge: 09/11/2019  Attending Physician:  Rosalin Hawking, MD, Stroke MD Consultant(s):   Will Camitz, ME (Heart Failure Team), [facility]   Patient's PCP:  Tamsen Roers, MD  DISCHARGE DIAGNOSIS:  Principal Problem:   Embolic stroke involving left middle cerebral artery (HCC) d/t AF s/p tPA Active Problems:   DM type 2 (diabetes mellitus, type 2) ([place])   Essential hypertension   Malnutrition ([place])   Aphasia due to old cerebral infarction   Dysphagia   Atrial fibrillation with RVR ([place])   Hyperlipidemia   Palliative care status  Allergies as of 09/11/2019   No Known Allergies     Medication List    STOP taking these medications   aspirin 81 MG tablet   atorvastatin 10 MG tablet Commonly known as: LIPITOR   calcium citrate-vitamin D 315-200 MG-UNIT tablet Commonly known as: CITRACAL+D   ergocalciferol 1.25 MG (50000 UT) capsule Commonly known as: VITAMIN D2   lisinopril 2.5 MG tablet Commonly known as: ZESTRIL   metFORMIN 500 MG tablet Commonly known as: GLUCOPHAGE   metoprolol tartrate 25 MG tablet Commonly known as: LOPRESSOR   multivitamin with minerals Tabs tablet   omega-3 acid ethyl esters 1 g capsule Commonly known as: LOVAZA     TAKE these medications   acetaminophen 650 MG suppository Commonly known as: TYLENOL Place 1 suppository (650 mg total) rectally every 6 (six) hours as needed for mild pain (or Fever >/= 101).   haloperidol 2 MG/ML solution Commonly known as: HALDOL Place 0.3 mLs (0.6 mg total) under the tongue every 4 (four) hours as needed for agitation (or delirium).   LORazepam 2 MG/ML concentrated solution Commonly known as: ATIVAN Place 0.5 mLs (1 mg total) under the tongue every 4 (four) hours as needed for anxiety.   morphine CONCENTRATE 10 mg / 0.5 ml concentrated solution Place  0.05-0.1 mLs (1-2 mg total) under the tongue every 3 (three) hours as needed for severe pain.   polyvinyl alcohol 1.4 % ophthalmic solution Commonly known as: LIQUIFILM TEARS Place 1 drop into both eyes 4 (four) times daily as needed for dry eyes.   scopolamine 1 MG/3DAYS Commonly known as: TRANSDERM-SCOP Place 1 patch (1.5 mg total) onto the skin every 3 (three) days.       LABORATORY STUDIES CBC    Component Value Date/Time   WBC 11.6 (H) 09/08/2019 0321   RBC 4.50 09/08/2019 0321   HGB 12.7 09/08/2019 0321   HCT 38.4 09/08/2019 0321   PLT 162 09/08/2019 0321   MCV 85.3 09/08/2019 0321   MCH 28.2 09/08/2019 0321   MCHC 33.1 09/08/2019 0321   RDW 13.4 09/08/2019 0321   LYMPHSABS 1.2 09/04/2019 1451   MONOABS 0.7 09/04/2019 1451   EOSABS 0.1 09/04/2019 1451   BASOSABS 0.0 09/04/2019 1451   CMP    Component Value Date/Time   NA 138 09/08/2019 0321   K 3.8 09/08/2019 0321   CL 103 09/08/2019 0321   CO2 22 09/08/2019 0321   GLUCOSE 302 (H) 09/08/2019 0321   BUN 26 (H) 09/08/2019 0321   CREATININE 0.65 09/08/2019 0321   CALCIUM 8.0 (L) 09/08/2019 0321   PROT 5.8 (L) 09/04/2019 1451   ALBUMIN 3.3 (L) 09/04/2019 1451   AST 33 09/04/2019 1451   ALT 57 (H) 09/04/2019 1451  ALKPHOS 58 09/04/2019 1451   BILITOT 0.6 09/04/2019 1451   GFRNONAA >60 09/08/2019 0321   GFRAA >60 09/08/2019 0321   COAGS Lab Results  Component Value Date   INR 1.0 09/04/2019   Lipid Panel    Component Value Date/Time   CHOL 91 09/05/2019 0522   TRIG 67 09/05/2019 0522   HDL 37 (L) 09/05/2019 0522   CHOLHDL 2.5 09/05/2019 0522   VLDL 13 09/05/2019 0522   LDLCALC 41 09/05/2019 0522   HgbA1C  Lab Results  Component Value Date   HGBA1C 6.1 (H) 09/05/2019    SIGNIFICANT DIAGNOSTIC STUDIES CT HEAD CODE STROKE WO CONTRAST 09/04/2019 IMPRESSION:  1. No acute intracranial hemorrhage or evidence of acute infarction. Increased density of left carotid terminus and MCA suggesting  intraluminal thrombus.  2. ASPECTS is 10   CT Code Stroke CTA Head W/WO contrast CT Code Stroke CTA Neck W/WO contrast CT Code Stroke Cerebral Perfusion with contrast 09/04/2019 IMPRESSION:  1. Acute anterior left MCA territory infarct.  2. Ischemic penumbra involving the entirety of the posterior portion of the left MCA territory which is partially fed by collaterals.  3. Emergent large vessel occlusion of the distal left M1 segment.  4. 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  5. Atherosclerotic changes at the aortic arch and carotid bifurcations without significant stenosis otherwise.  6. Distal small vessel disease throughout the circle-of-Willis without other significant proximal stenosis, aneurysm, or branch vessel occlusion.  7. Multilevel spondylosis in the cervical spine.  CT HEAD WO CONTRAST 09/04/2019 IMPRESSION:  Progression of left MCA hyperdensity compatible with further thrombosis of the left M1 segment extending into the left M2 branches. The left internal carotid artery is occluded on CTA. Possible early signs of infarction in the left frontal white matter and insula. No acute intracranial hemorrhage   MR BRAIN WO CONTRAST 09/05/2019 IMPRESSION:  Large territory left MCA infarct. Mild amount of hemorrhage left putamen Occlusion left internal carotid artery   DG CHEST PORT 1 VIEW 09/08/2019 IMPRESSION:  1. Enteric tube in place, side hole at the level of the stomach.  2.  No acute cardiopulmonary abnormality.    DG Abd Portable 1V 09/06/2019 IMPRESSION:  Benign-appearing abdomen. Nasogastric tube tip is in the midbody of the stomach. Aortic Atherosclerosis (ICD10-I70.0).  DG Swallowing Func-Speech Pathology 09/06/2019 CHL IP TREATMENT RECOMMENDATION 09/06/2019 Treatment Recommendations Therapy as outlined in treatment plan below   Prognosis 09/06/2019 Prognosis for Safe  Diet Advancement Fair Barriers to Reach Goals -- Barriers/Prognosis Comment --  CHL IP DIET RECOMMENDATION 09/06/2019 SLP Diet Recommendations Honey thick liquids;Dysphagia 1 (Puree) solids Liquid Administration via Cup Medication Administration Via alternative means Compensations Slow rate;Small sips/bites;Multiple dry swallows after each bite/sip Postural Changes Seated upright at 90 degrees    CHL IP OTHER RECOMMENDATIONS 09/06/2019 Recommended Consults -- Oral Care Recommendations Oral care QID Other Recommendations --    CHL IP FOLLOW UP RECOMMENDATIONS 09/06/2019 Follow up Recommendations (No Data)    CHL IP FREQUENCY AND DURATION 09/06/2019 Speech Therapy Frequency (ACUTE ONLY) min 2x/week Treatment Duration 2 weeks      ECHOCARDIOGRAM COMPLETE 09/05/2019 IMPRESSIONS   1. Left ventricular ejection fraction, by visual estimation, is 60 to 65%. The left ventricle has normal function. There is no left ventricular hypertrophy.   2. Elevated left atrial pressure.   3. Left ventricular diastolic parameters are consistent with Grade II diastolic dysfunction (pseudonormalization).   4. The left  ventricle has no regional wall motion abnormalities.   5. Global right ventricle has normal systolic function.The right ventricular size is normal.   6. Left atrial size was mildly dilated.   7. Right atrial size was mildly dilated.   8. Mild mitral annular calcification.   9. The mitral valve is normal in structure. Mild to moderate mitral valve regurgitation. No evidence of mitral stenosis.  10. The tricuspid valve is normal in structure.  11. The aortic valve is tricuspid. Aortic valve regurgitation is mild. Mild aortic valve sclerosis without stenosis.  12. The pulmonic valve was normal in structure. Pulmonic valve regurgitation is trivial.  13. Moderately elevated pulmonary artery systolic pressure.  14. The inferior vena cava is normal in size with greater than 50% respiratory variability,  suggesting right atrial pressure of 3 mmHg.  15. Normal LV systolic function; grade 2 diastolic dysfunction; mild AI; mild to moderate MR; biatrial enlargement; akinesis of the apical RV free wall; mild TR; moderate pulmonary hypertension.   HISTORY OF PRESENT ILLNESS NIALA CABBAGESTALK is a 85 y.o. female with past medical history of HTN, HLD, DM2, GERD, pancreatitis, who presents to [facility] ED for sudden onset of right side weakness, aphasia and facial droop. Family was present with her, she was LKW at 1345 on 09/04/2019 when she went into the bathroom, and family went to check on her and found her with symptoms. CT head no acute abnormality. NIHSS 23. mRS 0. She received IV TPA at 1511. CTA head/neck with L MCA oculsion. Given her severely advanced age do not think that there is a significant chance at return to functional independence after this, and there is a chance of harm from IR. Discussed this with Dr. Arizona Constable of [place] and he agrees to not proceed with the intervention. She was admitted to neuro ICU.  HOSPITAL COURSE Ms. CIANI QUARLES is a 85 y.o. female who presented with L MCA syndrome. She was treated with IV Alteplase 09/04/2019 at 1511, but unfortunately, not a candidate for thrombectomy d/t significant infarct area on CTP.   Stroke: large cardieoemboic LMCA infarct s/p tPA with mild hemorrhage.  Etiology left ICA occlusion from new diagnosed atrial fibrillation   CT Head -    ASPECTS 10  CTA H&N - L MCA oculsion  CT Perfusion- large core with also large penumbra  MRI head- large L MCA infarct, mild hemorrhage L putamen   2D Echo - EF 60-65%. No source of embolus. B atrium dilated   [employer] Virus 2  neg  LDL - 41  HgbA1c - 6.1  81mg  ASA prior to admission   DNR on comfort care measures  Family requested home hospice  Atrial Fibrillation w/ RVR, new onset 12/24  CHA2DS2-VASc Score = 7, ?2 oral anticoagulation recommended             Age in Years:  ?41   +2                         Sex:  Female   Female   +1                      Hypertension History:  yes   +1                        Diabetes Mellitus:  yes   +1      Congestive Heart Failure History:  0  Vascular Disease History:  0                           Stroke/TIA/Thromboembolism History:  yes   +2  TSH normal range  On comfort care measures   Hypertension  Home BP meds: zestril, metoprolol 25 daily  Stable  No BP meds for comfort care measures  Hyperlipidemia  Home Lipid lowering medication: lipitor 10mg , lovaza  LDL 41, goal < 70  Diabetes  Home diabetic meds: glucophage  HgbA1c 6.1  Dysphagia Malnutrition  Secondary to stroke  NPO for comfort care measures  Other Stroke Risk Factors  Advanced age  Incontinence. Will d/c w/ foley given end of life care  DISCHARGE EXAM Blood pressure (!) 155/114, pulse (!) 113, temperature 98.1 F (36.7 C), temperature source Oral, resp. rate 20, height 5' 2.25" (1.581 m), weight 65.1 kg, SpO2 95 %. General - Well nourished, well developed, in no apparent distress.  Ophthalmologic - fundi not visualized due to comfort care measures.  Cardiovascular - irregularly irregular heart rate and rhythm.  Neuro - limited due to comfort care measures. Drowsy but eyes open on voice, global aphasia, not following commands. Left gaze preference, barely cross midline, right neglect. Blinking to visual threat on the left but not on the right. Right facial droop. LUE and LLE some purposeful movement. RUE and RLE no movement. Sensation, coordination and gait not tested.  Discharge Diet   NPO  DISCHARGE PLAN  Disposition:  Home with [facility] bed for home use   DNR  Comfort care  35 minutes were spent preparing discharge.  Rosalin Hawking, MD PhD Stroke Neurology 09/11/2019 11:34 PM

## 2019-09-11 DIAGNOSIS — I4891 Unspecified atrial fibrillation: Secondary | ICD-10-CM

## 2019-09-11 DIAGNOSIS — I10 Essential (primary) hypertension: Secondary | ICD-10-CM

## 2019-09-11 DIAGNOSIS — Z515 Encounter for palliative care: Secondary | ICD-10-CM

## 2019-09-11 DIAGNOSIS — E7800 Pure hypercholesterolemia, unspecified: Secondary | ICD-10-CM

## 2019-09-11 MED ORDER — POLYVINYL ALCOHOL 1.4 % OP SOLN: 1.0000 [drp] | Freq: Four times a day (QID) | OPHTHALMIC | 0 refills | Status: AC | PRN

## 2019-09-11 MED ORDER — SCOPOLAMINE 1 MG/3DAYS TD PT72: 1.0000 | MEDICATED_PATCH | TRANSDERMAL | 12 refills | Status: AC

## 2019-09-11 MED ORDER — MORPHINE SULFATE (CONCENTRATE) 10 MG /0.5 ML PO SOLN: 1.0000 mg | ORAL | 0 refills | Status: AC | PRN

## 2019-09-11 MED ORDER — HALOPERIDOL LACTATE 2 MG/ML PO CONC: 0.6000 mg | ORAL | 0 refills | Status: AC | PRN

## 2019-09-11 MED ORDER — LORAZEPAM 2 MG/ML PO CONC: 1.0000 mg | ORAL | 0 refills | Status: AC | PRN

## 2019-09-11 MED ORDER — ACETAMINOPHEN 650 MG RE SUPP: 650.0000 mg | Freq: Four times a day (QID) | RECTAL | 0 refills | Status: AC | PRN

## 2019-09-11 NOTE — Progress Notes (Signed)
Hydrologist [facility])  Hospital Liaison: RN note   Family has decided to take the patient home with hospice support. Chart and patient information  reviewed by [facility] physician. Hospice eligibility confirmed.    Writer spoke with daughter, Holley Raring to initiate education related to hospice philosophy, services and team approach to care. Glenda verbalized understanding of information given. Per discussion, plan is for discharge to home by PTAR.   Please send signed and completed DNR form home with patient/family. Patient will need prescriptions for discharge comfort medications.   Family has requested that patient have a Foley Catheter place prior to discharge.   DME needs have been discussed. Patient/family requests the following DME for delivery to the home:        Hospital Bed. [place] will order DME.  Please call with any hospice related questions.    Thank you for this referral.   Farrel Gordon, RN, CCM  [facility] [place] (listed on AMION under Hospice and [facility] [place]) [phone]

## 2019-09-11 NOTE — TOC Transition Note (Signed)
Transition of Care [facility]) - CM/SW Discharge Note   Patient Details  Name: SADIRA YANO MRN: [id number] Date of Birth: 1926-03-06  Transition of Care [facility]) CM/SW Contact:  Geralynn Ochs, LCSW Phone Number: 09/11/2019, 2:34 PM   Clinical Narrative:   Patient discharging home with hospice, equipment has been delivered. CSW confirmed address with daughter and requested PTAR pick up. No other needs identified.    Final next level of care: Home w Hospice Care Barriers to Discharge: Barriers Resolved   Patient Goals and CMS Choice Patient states their goals for this hospitalization and ongoing recovery are:: Family wants patient to transition to comfort care CMS Medicare.gov Compare Post Acute Care list provided to:: Patient Represenative (must comment) Choice offered to / list presented to : Adult Children  Discharge Placement                Patient to be transferred to facility by: [place] Name of family member notified: Glenda Patient and family notified of of transfer: 09/11/19  Discharge Plan and Services In-house Referral: Clinical Social Work   Post Acute Care Choice: Hospice                               Social Determinants of Health (SDOH) Interventions     Readmission Risk Interventions No flowsheet data found.

## 2019-09-11 NOTE — Progress Notes (Signed)
Nutrition Brief Note  Chart reviewed. Pt is currently comfort care.  No further nutrition interventions warranted at this time.  Please re-consult as needed.   Corrin Parker, MS, RD, LDN Pager # [phone] After hours/ weekend pager # [phone]

## 2019-09-11 NOTE — Care Management Important Message (Signed)
Important Message  Patient Details  Name: [name] [name] MRN: [id number] Date of Birth: 08-15-1926   Medicare Important Message Given:  Yes     Shelda Altes 09/11/2019, 3:27 PM

## 2019-09-11 NOTE — Plan of Care (Signed)
  Problem: Education: Goal: Knowledge of disease or condition will improve Outcome: Progressing Goal: Knowledge of secondary prevention will improve Outcome: Progressing Goal: Knowledge of patient specific risk factors addressed and post discharge goals established will improve Outcome: Progressing Goal: Individualized Educational Video(s) Outcome: Progressing   Problem: Coping: Goal: Will verbalize positive feelings about self Outcome: Progressing Goal: Will identify appropriate support needs Outcome: Progressing   Problem: Health Behavior/Discharge Planning: Goal: Ability to manage health-related needs will improve Outcome: Progressing   Problem: Self-Care: Goal: Ability to participate in self-care as condition permits will improve Outcome: Progressing Goal: Verbalization of feelings and concerns over difficulty with self-care will improve Outcome: Progressing Goal: Ability to communicate needs accurately will improve Outcome: Progressing   Problem: Nutrition: Goal: Risk of aspiration will decrease Outcome: Progressing Goal: Dietary intake will improve Outcome: Progressing   Problem: Ischemic Stroke/TIA Tissue Perfusion: Goal: Complications of ischemic stroke/TIA will be minimized Outcome: Progressing   Problem: Education: Goal: Knowledge of General Education information will improve Description: Including pain rating scale, medication(s)/side effects and non-pharmacologic comfort measures Outcome: Progressing   Problem: Health Behavior/Discharge Planning: Goal: Ability to manage health-related needs will improve Outcome: Progressing   Problem: Clinical Measurements: Goal: Ability to maintain clinical measurements within normal limits will improve Outcome: Progressing Goal: Will remain free from infection Outcome: Progressing Goal: Diagnostic test results will improve Outcome: Progressing Goal: Respiratory complications will improve Outcome: Progressing Goal:  Cardiovascular complication will be avoided Outcome: Progressing   Problem: Activity: Goal: Risk for activity intolerance will decrease Outcome: Progressing   Problem: Nutrition: Goal: Adequate nutrition will be maintained Outcome: Progressing   Problem: Coping: Goal: Level of anxiety will decrease Outcome: Progressing   Problem: Elimination: Goal: Will not experience complications related to bowel motility Outcome: Progressing Goal: Will not experience complications related to urinary retention Outcome: Progressing   Problem: Pain Managment: Goal: General experience of comfort will improve Outcome: Progressing   Problem: Safety: Goal: Ability to remain free from injury will improve Outcome: Progressing   Problem: Skin Integrity: Goal: Risk for impaired skin integrity will decrease Outcome: Progressing   Problem: Education: Goal: Knowledge of secondary prevention will improve Outcome: Progressing   Problem: Ischemic Stroke/TIA Tissue Perfusion: Goal: Complications of ischemic stroke/TIA will be minimized Outcome: Progressing   Ival Bible, BSN, RN

## 2019-10-15 DEATH — deceased

## 2019-12-05 ENCOUNTER — Other Ambulatory Visit: Payer: Self-pay

## 2019-12-05 NOTE — Patient Outreach (Signed)
First telephone outreach attempt to obtain mRS. CMA spoke with patients daughter who stated patient patient expired 10/11/19. mRS=6  CMA shared her condolences and stated patients chart will be flagged to reflect deceased.  Ina Homes [facility] Management Assistant [phone]
